# Patient Record
Sex: Female | Born: 1980 | Race: White | Hispanic: No | Marital: Married | Smoking: Never smoker
Health system: Southern US, Community
[De-identification: ages and names within clinical notes are randomized; demographics above are authoritative.]

## PROBLEM LIST (undated history)

## (undated) DIAGNOSIS — O24419 Gestational diabetes mellitus in pregnancy, unspecified control: Secondary | ICD-10-CM

## (undated) DIAGNOSIS — K219 Gastro-esophageal reflux disease without esophagitis: Secondary | ICD-10-CM

## (undated) DIAGNOSIS — F32A Depression, unspecified: Secondary | ICD-10-CM

## (undated) DIAGNOSIS — J302 Other seasonal allergic rhinitis: Secondary | ICD-10-CM

## (undated) DIAGNOSIS — F419 Anxiety disorder, unspecified: Secondary | ICD-10-CM

## (undated) DIAGNOSIS — S82122A Displaced fracture of lateral condyle of left tibia, initial encounter for closed fracture: Secondary | ICD-10-CM

## (undated) HISTORY — DX: Gestational diabetes mellitus in pregnancy, unspecified control: O24.419

## (undated) HISTORY — DX: Anxiety disorder, unspecified: F41.9

## (undated) HISTORY — DX: Depression, unspecified: F32.A

## (undated) HISTORY — DX: Gastro-esophageal reflux disease without esophagitis: K21.9

---
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
## 2004-09-16 ENCOUNTER — Inpatient Hospital Stay: Payer: Self-pay | Admitting: Obstetrics and Gynecology

## 2006-02-24 ENCOUNTER — Ambulatory Visit: Payer: Self-pay

## 2006-10-20 ENCOUNTER — Ambulatory Visit: Payer: Self-pay | Admitting: Internal Medicine

## 2007-01-28 ENCOUNTER — Ambulatory Visit: Payer: Self-pay | Admitting: Internal Medicine

## 2008-01-07 ENCOUNTER — Ambulatory Visit: Payer: Self-pay | Admitting: Internal Medicine

## 2008-01-07 LAB — CONVERTED CEMR LAB
Free T4: 0.6 ng/dL (ref 0.6–1.6)
T3, Free: 3 pg/mL (ref 2.3–4.2)
TSH: 4.12 microintl units/mL (ref 0.35–5.50)

## 2008-03-15 ENCOUNTER — Encounter: Admission: RE | Admit: 2008-03-15 | Discharge: 2008-03-15 | Payer: Self-pay | Admitting: Obstetrics and Gynecology

## 2008-05-18 ENCOUNTER — Inpatient Hospital Stay (HOSPITAL_COMMUNITY): Admission: RE | Admit: 2008-05-18 | Discharge: 2008-05-21 | Payer: Self-pay | Admitting: Obstetrics and Gynecology

## 2008-11-29 ENCOUNTER — Ambulatory Visit: Payer: Self-pay | Admitting: Internal Medicine

## 2008-11-29 DIAGNOSIS — K625 Hemorrhage of anus and rectum: Secondary | ICD-10-CM

## 2009-01-15 ENCOUNTER — Emergency Department (HOSPITAL_COMMUNITY): Admission: EM | Admit: 2009-01-15 | Discharge: 2009-01-15 | Payer: Self-pay | Admitting: Emergency Medicine

## 2009-03-14 ENCOUNTER — Ambulatory Visit: Payer: Self-pay | Admitting: Internal Medicine

## 2009-03-14 DIAGNOSIS — N390 Urinary tract infection, site not specified: Secondary | ICD-10-CM | POA: Insufficient documentation

## 2009-03-14 LAB — CONVERTED CEMR LAB
Bilirubin Urine: NEGATIVE
Specific Gravity, Urine: >=1.03 (ref 1.000–1.030)
Urobilinogen, UA: 0.2 (ref 0.0–1.0)
pH: 5.5 (ref 5.0–8.0)

## 2009-06-28 ENCOUNTER — Ambulatory Visit: Payer: Self-pay | Admitting: Internal Medicine

## 2009-06-28 LAB — CONVERTED CEMR LAB: hCG, Beta Chain, Quant, S: <0.5 milliintl units/mL

## 2009-06-29 LAB — HM PAP SMEAR

## 2010-02-19 ENCOUNTER — Ambulatory Visit: Payer: Self-pay | Admitting: Internal Medicine

## 2010-02-19 ENCOUNTER — Ambulatory Visit: Payer: Self-pay | Admitting: Cardiology

## 2010-02-19 DIAGNOSIS — R300 Dysuria: Secondary | ICD-10-CM | POA: Insufficient documentation

## 2010-02-19 LAB — CONVERTED CEMR LAB
Bilirubin Urine: NEGATIVE
Leukocytes, UA: NEGATIVE
Specific Gravity, Urine: 1.025 (ref 1.005–1.030)
Urine Glucose: NEGATIVE mg/dL
Urobilinogen, UA: 0.2 (ref 0.0–1.0)

## 2010-02-27 ENCOUNTER — Ambulatory Visit: Payer: Self-pay | Admitting: Family Medicine

## 2010-02-27 DIAGNOSIS — Z862 Personal history of diseases of the blood and blood-forming organs and certain disorders involving the immune mechanism: Secondary | ICD-10-CM | POA: Insufficient documentation

## 2010-02-27 DIAGNOSIS — Z8632 Personal history of gestational diabetes: Secondary | ICD-10-CM | POA: Insufficient documentation

## 2010-02-27 DIAGNOSIS — R5381 Other malaise: Secondary | ICD-10-CM

## 2010-02-27 DIAGNOSIS — Z8639 Personal history of other endocrine, nutritional and metabolic disease: Secondary | ICD-10-CM

## 2010-02-27 DIAGNOSIS — Z8742 Personal history of other diseases of the female genital tract: Secondary | ICD-10-CM

## 2010-02-27 DIAGNOSIS — J309 Allergic rhinitis, unspecified: Secondary | ICD-10-CM | POA: Insufficient documentation

## 2010-02-27 DIAGNOSIS — R5383 Other fatigue: Secondary | ICD-10-CM

## 2010-02-27 DIAGNOSIS — K219 Gastro-esophageal reflux disease without esophagitis: Secondary | ICD-10-CM | POA: Insufficient documentation

## 2010-02-28 ENCOUNTER — Encounter: Payer: Self-pay | Admitting: Family Medicine

## 2010-02-28 LAB — CONVERTED CEMR LAB
ALT: 13 units/L (ref 0–35)
AST: 15 units/L (ref 0–37)
Albumin: 4.1 g/dL (ref 3.5–5.2)
Basophils Relative: 0.6 % (ref 0.0–3.0)
CO2: 30 meq/L (ref 19–32)
Eosinophils Relative: 1.1 % (ref 0.0–5.0)
Glucose, Bld: 76 mg/dL (ref 70–99)
HDL: 51.3 mg/dL (ref >39.00)
Hgb A1c MFr Bld: 5.5 % (ref 4.6–6.5)
MCV: 92 fL (ref 78.0–100.0)
Monocytes Absolute: 0.3 10*3/uL (ref 0.1–1.0)
Monocytes Relative: 5.5 % (ref 3.0–12.0)
Neutrophils Relative %: 53.3 % (ref 43.0–77.0)
Platelets: 223 10*3/uL (ref 150.0–400.0)
Potassium: 4.1 meq/L (ref 3.5–5.1)
RBC: 4.25 M/uL (ref 3.87–5.11)
Sodium: 143 meq/L (ref 135–145)
TSH: 0.77 microintl units/mL (ref 0.35–5.50)
Total Bilirubin: 0.5 mg/dL (ref 0.3–1.2)
Total CHOL/HDL Ratio: 2
Triglycerides: 68 mg/dL (ref 0.0–149.0)
VLDL: 13.6 mg/dL (ref 0.0–40.0)
WBC: 6.3 10*3/uL (ref 4.5–10.5)

## 2010-03-02 ENCOUNTER — Telehealth: Payer: Self-pay | Admitting: Family Medicine

## 2010-06-05 ENCOUNTER — Ambulatory Visit: Payer: Self-pay | Admitting: Family Medicine

## 2010-06-05 DIAGNOSIS — L723 Sebaceous cyst: Secondary | ICD-10-CM

## 2010-06-05 DIAGNOSIS — L259 Unspecified contact dermatitis, unspecified cause: Secondary | ICD-10-CM

## 2010-06-14 ENCOUNTER — Ambulatory Visit: Payer: Self-pay | Admitting: Internal Medicine

## 2010-06-14 LAB — CONVERTED CEMR LAB
Leukocytes, UA: NEGATIVE
Nitrite: NEGATIVE
Specific Gravity, Urine: 1.02 (ref 1.000–1.030)
Total Protein, Urine: NEGATIVE mg/dL
pH: 6.5 (ref 5.0–8.0)

## 2010-07-04 ENCOUNTER — Encounter: Payer: Self-pay | Admitting: Family Medicine

## 2010-10-16 NOTE — Assessment & Plan Note (Signed)
Summary: NEW PT TO EST/CLE   Vital Signs:  Patient profile:   30 year old female Height:      63 inches Weight:      129.50 pounds BMI:     23.02 Temp:     98.6 degrees F oral Pulse rate:   64 / minute Pulse rhythm:   regular BP sitting:   90 / 72  (left arm) Cuff size:   regular  Vitals Entered By: Linde Gillis CMA Duncan Dull) (February 27, 2010 10:51 AM) CC: new patient   History of Present Illness: 30 yo very pleasant female here to establish care.   Had UTI earlier this month. CT of abdomen/pelvis due to suprapubic pain, neg for nephrolithiasis but she does have 14 mm gallstone without gallbladder thickening.  Denies any symptoms of billiary colic.  H/o gestational DM- with last pregnancy, was diet controlled.  Recently has had episodes of hypoglyemia, CBG in 60s only a couple of hours after eating.  Feels dizzy and sweaty.  Does have FH of DM.  Denies any polyuria or polydypsia.  Has been more tired lately.  No CP or SOB.  Well woman- UTD on prevention. Had lipid panel checked at work at couple of years ago, WNL. Dr.  Billy Coast is Gyn. 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 Physically active, walks on treadmill, has two small children.   Preventive Screening-Counseling & Management  Caffeine-Diet-Exercise     Does Patient Exercise: yes  Current Medications (verified): 1)  Nexium 40 Mg Cpdr (Esomeprazole Magnesium) .... Take One Tablet By Mouth Daily 2)  Ortho Tri-Cyclen Lo 0.18/0.215/0.25 Mg-25 Mcg Tabs (Norgestim-Eth Estrad Triphasic) .... Take One Tablet By Mouth Daily 3)  Zyrtec Allergy 10 Mg Tabs (Cetirizine Hcl) .... Take One Tablet By Mouth Daily  Allergies: 1)  ! 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  Alcohol Use - yes Daily Caffeine Use Illicit Drug Use - no Regular exercise-yes  Risk Factors: Exercise: yes (02/27/2010)  Risk Factors: Smoking Status: never (11/29/2008)  Past Medical History: Gestational diabetes GERD Allergic rhinitis  Past Surgical History: C-sections x 2 (2006, 2009)  Family History: Reviewed history from 11/29/2008 and no changes required. Family History of Colon Polyps:Father Family History of Diabetes: Grandmother Dad died of brain tumor at 59 Mom alive and healthy  Social History: Reviewed history from 11/29/2008 and no changes required. 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  Alcohol Use - yes Daily Caffeine Use Illicit Drug Use - no Regular exercise-yes Does Patient Exercise:  yes  Review of Systems      See HPI General:  Denies malaise. Eyes:  Denies blurring. ENT:  Denies difficulty swallowing. CV:  Denies chest pain or discomfort and shortness of breath with exertion. Resp:  Denies shortness of breath. GI:  Denies abdominal pain, bloody stools, change in bowel habits, nausea, and vomiting. GU:  Denies discharge and dysuria. MS:  Denies joint pain, joint redness, and joint swelling. Derm:  Denies rash. Neuro:  Denies weakness. Psych:  Denies anxiety and depression. Endo:  Denies cold intolerance and heat intolerance. Heme:  Denies abnormal bruising and bleeding.  Physical Exam  General:  Well developed, well nourished, no acute distress. Head:  normocephalic and atraumatic.   Eyes:  vision grossly intact and pupils equal.   Ears:  R ear normal and L ear normal.   Nose:  no external deformity.   Mouth:  good dentition.   Neck:  No deformities, masses, or tenderness noted. Lungs:  Normal respiratory effort, chest expands symmetrically. Lungs are clear to auscultation, no crackles or wheezes. Heart:  Normal rate and regular rhythm. S1 and S2  normal without gallop, murmur, click, rub or other extra sounds. Abdomen:  Bowel sounds positive,abdomen soft and non-tender without masses, organomegaly or hernias noted. Extremities:  No clubbing, cyanosis, edema, or deformity noted with normal full range of motion of all joints.   Neurologic:  alert & oriented X3.   Skin:  nevus, no dysplastic nevi noted.   Psych:  Oriented X3, memory intact for recent and remote, normally interactive, good eye contact, not anxious appearing, and not depressed appearing.     Impression & Recommendations:  Problem # 1:  Preventive Health Care (ICD-V70.0) Reviewed preventive care protocols, scheduled due services, and updated immunizations Discussed nutrition, exercise, diet, and healthy lifestyle. FLP, hepatic panel today.  Problem # 2:  FATIGUE (ICD-780.79) Assessment: New LIkely due to busy lifestyle.  Will check TSH and CBC today. Orders: Venipuncture (40981) TLB-TSH (Thyroid Stimulating Hormone) (84443-TSH) TLB-CBC Platelet - w/Differential (85025-CBCD)  Problem # 3:  DIABETES MELLITUS, GESTATIONAL, HX OF (ICD-V13.29) Assessment: Unchanged with recent episodes of symptomatic hyoglycemia.  Will check a1c today. Orders: TLB-BMP (Basic Metabolic Panel-BMET) (80048-METABOL) TLB-A1C / Hgb A1C (Glycohemoglobin) (83036-A1C)  Complete Medication List: 1)  Nexium 40 Mg Cpdr (Esomeprazole magnesium) .... Take one tablet by mouth daily 2)  Ortho Tri-cyclen Lo 0.18/0.215/0.25 Mg-25 Mcg Tabs (Norgestim-eth estrad triphasic) .... Take one tablet by mouth daily 3)  Zyrtec Allergy 10 Mg Tabs (Cetirizine hcl) .... Take one tablet by mouth daily  Other Orders: TLB-Lipid Panel (80061-LIPID) TLB-Hepatic/Liver Function Pnl (80076-HEPATIC)  Current Allergies (reviewed today): ! CODEINE  TD Result Date:  06/29/2009 TD Result:  historical TD Next Due:  10 yr PAP Result Date:  06/29/2009 PAP Result:  normal

## 2010-10-16 NOTE — Letter (Signed)
Summary: Generic Letter  [facility]  [address] [place], [place] [address]   Phone: [phone]  Fax: [phone]    02/28/2010  [facility] [address] [place], [place]  [address]  Dear Ms. Prime,   Dr. Dayton Martes says that all of your labs look great! Thyroid function normal. BMET normal.  A1C is ok, we can continue to monitor periodically. Cholesterol is excellent!  Keep up the good work!       Sincerely,        Ruthe Mannan, MD

## 2010-10-16 NOTE — Progress Notes (Signed)
Summary: lab report faxed  ---- Converted from flag ---- ---- 03/02/2010 8:24 AM, Ruthe Mannan MD wrote: please see below.  ok to fax her lab work to her. Thanks, Talia  ---- 03/02/2010 8:23 AM, Meredith Staggers, RN wrote: Cleone Slim Dr Dayton Martes, I received a letter from your office that my labwork was good which is great, I was just wondering if I could get a copy of my labs, your office can either mail them to me or fax them to me at the office at [phone] either way is fine with me, thanks so much ------------------------------  Lab report faxed to [phone] as instructed.Lewanda Rife LPN  March 02, 2010 11:19 AM

## 2010-10-16 NOTE — Consult Note (Signed)
Summary: [facility]  [facility]   Imported By: Lanelle Bal 07/27/2010 14:47:54  _____________________________________________________________________  External Attachment:    Type:   Image     Comment:   External Document

## 2010-10-16 NOTE — Assessment & Plan Note (Signed)
Summary: KNOT ON BACK OF HEAD/JRR   Vital Signs:  Patient profile:   30 year old female Height:      63 inches Weight:      128 pounds BMI:     22.76 Temp:     97.7 degrees F oral Pulse rate:   76 / minute Pulse rhythm:   regular BP sitting:   102 / 70  (left arm) Cuff size:   regular  Vitals Entered By: Linde Gillis CMA Duncan Dull) (June 05, 2010 11:46 AM) CC: knot on back of head   History of Present Illness: Very pleasant 30 yo with scalp eruption and now with "knot" on back of her head.  Noticed itchy rash on left side of scalp 3 weeks ago.  Benadryl has not helped. Tried OTC dandruff shampoo and most recently Nizoral shampoo with no relief of symptoms. Prior to developping the rash, no changes in her soaps or detergants.   Never had anything like this before.  3 days ago, noticed a large subcutaneous bump on the left side of her scalp as well.  Tender to palpation but less tender than it was a few days ago.  No overlying erythema, fevers, chills, nausea or vomiting.  Current Medications (verified): 1)  Nexium 40 Mg Cpdr (Esomeprazole Magnesium) .... Take One Tablet By Mouth Daily 2)  Ortho Tri-Cyclen Lo 0.18/0.215/0.25 Mg-25 Mcg Tabs (Norgestim-Eth Estrad Triphasic) .... Take One Tablet By Mouth Daily 3)  Zyrtec Allergy 10 Mg Tabs (Cetirizine Hcl) .... Take One Tablet By Mouth Daily 4)  Nizoral 2 % Sham (Ketoconazole) .... Use Daily, Lather, Rinse After 5- 10 Min 5)  Clobetasol Propionate 0.05 % Crea (Clobetasol Propionate) .... Apply To Affected Area Two Times A Day.  Allergies: 1)  ! 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  Alcohol Use - yes Daily Caffeine Use Illicit Drug Use - no Regular exercise-yes  Risk Factors: Exercise: yes (02/27/2010)  Risk Factors: Smoking Status: never (11/29/2008)  Review of Systems      See HPI General:  Denies chills and fever. Derm:  Complains of rash; denies poor wound healing.  Physical Exam  General:  Well developed, well nourished, no acute distress. Skin:  erythematous raised plaques on left side of scalp, mildly scaly. Palpable 3 cm subcutaneous cyst in scalp, no erythemta, mildly TTP. Right elbow- small scaly plaque Psych:  Oriented X3, memory intact for recent and remote, normally interactive, good eye contact, not anxious appearing, and not depressed appearing.     Impression & Recommendations:  Problem # 1:  CONTACT DERMATITIS&OTHER ECZEMA DUE UNSPEC CAUSE (ICD-692.9) Assessment New Etiology unknown.  Folliculitis vs. eczema or possible psorasis. Will try topical clobestasol cream and refer to derm for biopsy and further work up given small plaque on elbow as well (may be completely benign). Her updated medication list for this problem includes:    Zyrtec Allergy 10 Mg Tabs (Cetirizine hcl) .Marland Kitchen... Take one tablet by mouth daily    Clobetasol Propionate 0.05 % Crea (Clobetasol propionate) .Marland Kitchen... Apply to affected area two times a day.  Orders: Dermatology Referral (  Derma)  Problem # 2:  SEBACEOUS CYST (ICD-706.2) Assessment: New Can discuss removal if necessary with dermatology at her appointment but will hopefully be resolved by then.  Complete Medication List: 1)  Nexium 40 Mg Cpdr (Esomeprazole magnesium) .... Take one tablet by mouth daily 2)  Ortho Tri-cyclen Lo 0.18/0.215/0.25 Mg-25 Mcg Tabs (Norgestim-eth estrad triphasic) .... Take one tablet by mouth daily 3)  Zyrtec Allergy 10 Mg Tabs (Cetirizine hcl) .... Take one  tablet by mouth daily 4)  Nizoral 2 % Sham (Ketoconazole) .... Use daily, lather, rinse after 5- 10 min 5)  Clobetasol Propionate 0.05 % Crea (Clobetasol propionate) .... Apply to affected area two times a day.  Patient Instructions: 1)  Great to see you, Porchia. 2)  Please stop by to see Aram Beecham on your way out. Prescriptions: CLOBETASOL PROPIONATE 0.05 % CREA (CLOBETASOL PROPIONATE) apply to affected area two times a day.  #30 g x 0   Entered and Authorized by:   Ruthe Mannan MD   Signed by:   Ruthe Mannan MD on 06/05/2010   Method used:   Electronically to        [facility]* (retail)       [address]       [address] [place] [address]ing       [place], [place]  [address]       Ph: [phone]       Fax: [phone]   RxID:   [id number]   Current Allergies (reviewed today): ! CODEINE

## 2010-10-25 ENCOUNTER — Encounter: Payer: Self-pay | Admitting: Internal Medicine

## 2010-10-25 ENCOUNTER — Other Ambulatory Visit (INDEPENDENT_AMBULATORY_CARE_PROVIDER_SITE_OTHER): Payer: 59

## 2010-10-25 DIAGNOSIS — R0989 Other specified symptoms and signs involving the circulatory and respiratory systems: Secondary | ICD-10-CM

## 2010-10-25 DIAGNOSIS — R350 Frequency of micturition: Secondary | ICD-10-CM | POA: Insufficient documentation

## 2010-10-25 LAB — CONVERTED CEMR LAB
Bacteria, UA: NONE SEEN
Casts: NONE SEEN /lpf
Crystals: NONE SEEN
Ketones, ur: NEGATIVE mg/dL
Nitrite: NEGATIVE
Specific Gravity, Urine: 1.018 (ref 1.005–1.030)
Urobilinogen, UA: 0.2 (ref 0.0–1.0)
pH: 7 (ref 5.0–8.0)

## 2010-10-26 ENCOUNTER — Encounter: Payer: Self-pay | Admitting: Internal Medicine

## 2010-11-01 NOTE — Miscellaneous (Addendum)
Summary: RX  Sulfamethoxazole-TMP/Diflucan  Clinical Lists Changes  Medications: Added new medication of FLUCONAZOLE 150 MG TABS (FLUCONAZOLE) take as directed - Signed Added new medication of SULFAMETHOXAZOLE-TMP DS 800-160 MG TABS (SULFAMETHOXAZOLE-TRIMETHOPRIM) one two times a day for 3 days - Signed Rx of FLUCONAZOLE 150 MG TABS (FLUCONAZOLE) take as directed;  #2 x 1;  Signed;  Entered by: Charolotte Capuchin, RN;  Authorized by: Dolores Patty, MD, [facility];  Method used: Electronically to [facility]*, [address] Shipping/mailing, [place], [place]  [address], Ph: [phone], Fax: [phone] Rx of SULFAMETHOXAZOLE-TMP DS 800-160 MG TABS (SULFAMETHOXAZOLE-TRIMETHOPRIM) one two times a day for 3 days;  #6 x 0;  Signed;  Entered by: Charolotte Capuchin, RN;  Authorized by: Dolores Patty, MD, [facility];  Method used: Electronically to [facility]*, [address], [address] Shipping/mailing, [place], [place]  [address], Ph: [phone], Fax: [phone]    Prescriptions: SULFAMETHOXAZOLE-TMP DS 800-160 MG TABS (SULFAMETHOXAZOLE-TRIMETHOPRIM) one two times a day for 3 days  #6 x 0   Entered by:   Charolotte Capuchin, RN   Authorized by:   Dolores Patty, MD, [facility]   Signed by:   Charolotte Capuchin, RN on 10/26/2010   Method used:   Electronically to        [facility]* (retail)       [address]       [address] Shipping/mailing       [place], [place]  [address]       Ph: [phone]       Fax: [phone]   RxID:   [id number] FLUCONAZOLE 150 MG TABS (FLUCONAZOLE) take as directed  #2 x 1   Entered by:   Charolotte Capuchin, RN   Authorized by:   Dolores Patty, MD, [facility]   Signed by:   Charolotte Capuchin, RN on 10/26/2010   Method used:   Electronically to        [facility]* (retail)       [address]       [address] Shipping/mailing  [place], [place]  [address]       Ph: [phone]       Fax: [phone]   RxID:   [id number]

## 2011-01-29 NOTE — Op Note (Signed)
[name], [name]               ACCOUNT NO.:  [id number]   MEDICAL RECORD NO.:  [id number]          PATIENT TYPE:  INP   LOCATION:  9125                          [facility]ILITY:  [facility]   PHYSICIAN:  Lenoard Aden, M.D.DATE OF BIRTH:  1980-10-24   DATE OF PROCEDURE:  DATE OF DISCHARGE:                               OPERATIVE REPORT   PREOPERATIVE DIAGNOSIS:  Previous cesarean section now at 39 weeks.   POSTOPERATIVE DIAGNOSIS:  Previous cesarean section now at 39 weeks.   PROCEDURE:  Repeat low-segment transverse cesarean section.   SURGEON:  Lenoard Aden, MD   ANESTHESIA:  Spinal.   ESTIMATED BLOOD LOSS:  Less than 800 mL.   COMPLICATIONS:  None.   DRAINS:  Foley.   COUNTS:  Correct.   The patient recovery in good condition.   FINDINGS:  Full-term living female.  Apgars 8 and 9.  Thin non-dehisced  lower uterine segment.  Normal tubes.  Normal ovaries.  Two-layer  closure.  There are some also filmy adhesions in the lower uterine to  the lower uterine segment to the anterior abdominal wall and bladder.   BRIEF OPERATIVE NOTE:  After being apprised of risks of anesthesia,  infection, bleeding, injury to abdominal organs, need for repair,  delayed versus immediate complications including bowel and bladder  injury, the patient brought to the operating room where she was  administered a spinal anesthetic without complications, and prepped and  draped in usual sterile fashion.  Foley catheter placed.  After  achieving anesthesia, dilute Marcaine solution placed.  Pfannenstiel  skin incision made with a scalpel, carried down to fascia, which was  nicked in the midline and transversely using Mayo scissors.  Rectus  muscles dissected sharply in the midline.  Peritoneum entered sharply.  Bladder blade placed.  Visceral peritoneum scoured sharply off the  uterine segment.  Thinning of the lower uterine segment was noted.  Kerr  hysterotomy incision made.  Atraumatic delivery  of full term living  female handed to pediatrician's attendance.  Apgars as noted.  Placenta  delivered manually.  Intact three-vessel cord.  Uterus curetted using a  dry lap pack and closed in 2 running imbricating layers of 0 Monocryl  suture.  Good hemostasis noted.  Bladder flap inspected, found to be hemostatic.  Irrigation  accomplished.  Good hemostasis achieved.  The fascia was then closed  using 0 Monocryl in a continuous running fashion.  Skin was  reapproximated using skin staples.  The patient tolerated the procedure  well and was transferred to recovery in good condition.      Lenoard Aden, M.D.  Electronically Signed     RJT/MEDQ  D:  05/18/2008  T:  05/19/2008  Job:  [id number]

## 2011-01-29 NOTE — H&P (Signed)
[name], [name]               ACCOUNT NO.:  [id number]   MEDICAL RECORD NO.:  [id number]          PATIENT TYPE:  INP   LOCATION:                                [facility]   PHYSICIAN:  Lenoard Aden, M.D.DATE OF BIRTH:  October 16, 1980   DATE OF ADMISSION:  05/18/2008  DATE OF DISCHARGE:                              HISTORY & PHYSICAL   CHIEF COMPLAINT:  Repeat C-section at 39 weeks.   She is a 30 year old white female G2, P1 at 54 weeks' gestation with a  history of previous C-section x1, who presents for repeat C-section at  39 weeks.   ALLERGIES:  She is allergic to VICODIN, CODEINE, MORPHINE DERIVATIVES,  and no known latex allergies.   MEDICATIONS:  Prenatal vitamins.   She is a nonsmoker, nondrinker.  Denies domestic physical violence.   She has a family history of heart disease, diabetes plus pancreatic,  colon, and brain cancer.   Prenatal course was complicated by gestational diabetes, well controlled  on diet.   PHYSICAL EXAMINATION:  GENERAL:  She is a well-developed, well-nourished  white female in no acute distress.  HEENT:  Normal.  LUNGS:  Clear.  HEART:  Regular rate and rhythm.  ABDOMEN:  Soft, gravid, and nontender.  Cervix is closed, 70% vertex, -  1.  EXTREMITIES:  There are no cords.  NEUROLOGIC:  Nonfocal.  SKIN:  Intact.   IMPRESSION:  1. A 39-week intrauterine pregnancy  2. Previous cesarean section.   PLAN:  Plan is to proceed with repeat low-transverse cesarean section.  The risks of anesthesia, infection, bleeding, injury to abdominal  organs, and need for repair were discussed and the labor's immediate  complications to include bowel and bladder injury.  The patient  wishes  to proceed.      Lenoard Aden, M.D.  Electronically Signed     RJT/MEDQ  D:  05/18/2008  T:  05/19/2008  Job:  [id number]

## 2011-02-01 NOTE — Discharge Summary (Signed)
NAMEMALINDA, [name]               ACCOUNT NO.:  [id number]   MEDICAL RECORD NO.:  [id number]          PATIENT TYPE:  INP   LOCATION:  9125                          [facility]ILITY[facility]   PHYSICIAN:  Lenoard Aden, M.D.DATE OF BIRTH:  Jul 16, 1981   DATE OF ADMISSION:  05/18/2008  DATE OF DISCHARGE:  05/21/2008                               DISCHARGE SUMMARY   ADMISSION DIAGNOSIS:  Previous cesarean section for repeat at term.   DISCHARGE DIAGNOSIS:  Previous cesarean section for repeat at term.   The patient underwent uncomplicated repeat low-segment transverse  cesarean section.   Postoperative course uncomplicated.  Tolerating diet well.   Discharged to home on day #3.  Discharge teaching done.   DISCHARGE MEDICATIONS:  Prenatal vitamins, iron, and Tylox.   Follow up in the office in 4-6 weeks.      Lenoard Aden, M.D.  Electronically Signed     RJT/MEDQ  D:  07/14/2008  T:  07/14/2008  Job:  [id number]

## 2011-04-24 ENCOUNTER — Ambulatory Visit: Payer: 59 | Admitting: *Deleted

## 2011-04-24 ENCOUNTER — Telehealth: Payer: Self-pay | Admitting: Cardiology

## 2011-04-24 DIAGNOSIS — R300 Dysuria: Secondary | ICD-10-CM

## 2011-04-24 LAB — URINALYSIS, ROUTINE W REFLEX MICROSCOPIC
Bilirubin Urine: NEGATIVE
Ketones, ur: NEGATIVE
Specific Gravity, Urine: >=1.03 (ref 1.000–1.030)
Urobilinogen, UA: 0.2 (ref 0.0–1.0)

## 2011-04-24 MED ORDER — SULFAMETHOXAZOLE-TRIMETHOPRIM 800-160 MG PO TABS: 1.0000 | ORAL_TABLET | Freq: Two times a day (BID) | ORAL | Status: AC

## 2011-04-24 MED ORDER — FLUCONAZOLE 100 MG PO TABS: 100.0000 mg | ORAL_TABLET | Freq: Every day | ORAL | Status: AC

## 2011-04-24 NOTE — Telephone Encounter (Signed)
Per Dr. Gala Romney, patient needed a UA and Culture.

## 2011-04-24 NOTE — Telephone Encounter (Signed)
Antbx. Ordered per Dr. Gala Romney.

## 2011-04-27 LAB — URINE CULTURE: Colony Count: 80000

## 2011-06-10 ENCOUNTER — Telehealth: Payer: Self-pay | Admitting: *Deleted

## 2011-06-10 DIAGNOSIS — N390 Urinary tract infection, site not specified: Secondary | ICD-10-CM

## 2011-06-10 MED ORDER — SULFAMETHOXAZOLE-TRIMETHOPRIM 800-160 MG PO TABS: ORAL_TABLET | ORAL | Status: DC

## 2011-06-10 MED ORDER — FLUCONAZOLE 100 MG PO TABS: ORAL_TABLET | ORAL | Status: DC

## 2011-06-10 NOTE — Telephone Encounter (Signed)
Called in for patient.

## 2011-06-19 LAB — GLUCOSE, CAPILLARY
Glucose-Capillary: 65 — ABNORMAL LOW
Glucose-Capillary: 68 — ABNORMAL LOW

## 2011-06-19 LAB — CBC
Hemoglobin: 11.3 — ABNORMAL LOW
Hemoglobin: 9.7 — ABNORMAL LOW
MCHC: 32.6
MCHC: 32.9
Platelets: 129 — ABNORMAL LOW
Platelets: 147 — ABNORMAL LOW
RDW: 13.8
RDW: 14.1

## 2011-06-19 LAB — BASIC METABOLIC PANEL
CO2: 21
Calcium: 8.8
Creatinine, Ser: 0.62
GFR calc non Af Amer: >60
Glucose, Bld: 101 — ABNORMAL HIGH
Sodium: 135

## 2011-06-19 LAB — RPR: RPR Ser Ql: NONREACTIVE

## 2011-09-26 ENCOUNTER — Encounter: Payer: Self-pay | Admitting: Family Medicine

## 2011-09-26 ENCOUNTER — Telehealth: Payer: Self-pay

## 2011-09-26 NOTE — Telephone Encounter (Signed)
Pt started today with burning and pain upon urination with frequency. Pt scheduled appt with Dr Alphonsus Sias 09/27/11 at 3pm.

## 2011-09-27 ENCOUNTER — Ambulatory Visit: Payer: 59 | Admitting: Internal Medicine

## 2011-11-15 ENCOUNTER — Ambulatory Visit (HOSPITAL_COMMUNITY)
Admission: RE | Admit: 2011-11-15 | Discharge: 2011-11-15 | Disposition: A | Discharge status: Home / Self Care | Payer: 59 | Source: Ambulatory Visit | Attending: Family Medicine | Admitting: Family Medicine

## 2011-11-15 ENCOUNTER — Telehealth: Payer: Self-pay | Admitting: Family Medicine

## 2011-11-15 ENCOUNTER — Encounter: Payer: Self-pay | Admitting: Family Medicine

## 2011-11-15 ENCOUNTER — Ambulatory Visit (INDEPENDENT_AMBULATORY_CARE_PROVIDER_SITE_OTHER): Payer: 59 | Admitting: Family Medicine

## 2011-11-15 ENCOUNTER — Other Ambulatory Visit: Payer: Self-pay | Admitting: Family Medicine

## 2011-11-15 VITALS — BP 100/70 | HR 72 | Temp 98.4°F | Ht 64.0 in | Wt 122.1 lb

## 2011-11-15 DIAGNOSIS — Z9889 Other specified postprocedural states: Secondary | ICD-10-CM | POA: Insufficient documentation

## 2011-11-15 DIAGNOSIS — K8020 Calculus of gallbladder without cholecystitis without obstruction: Secondary | ICD-10-CM | POA: Insufficient documentation

## 2011-11-15 DIAGNOSIS — R1011 Right upper quadrant pain: Secondary | ICD-10-CM | POA: Insufficient documentation

## 2011-11-15 DIAGNOSIS — R110 Nausea: Secondary | ICD-10-CM | POA: Insufficient documentation

## 2011-11-15 LAB — CBC WITH DIFFERENTIAL/PLATELET
Basophils Absolute: 0 10*3/uL (ref 0.0–0.1)
Eosinophils Relative: 1 % (ref 0–5)
Lymphocytes Relative: 39 % (ref 12–46)
Lymphs Abs: 2.8 10*3/uL (ref 0.7–4.0)
MCV: 88.2 fL (ref 78.0–100.0)
Neutro Abs: 4 10*3/uL (ref 1.7–7.7)
Neutrophils Relative %: 56 % (ref 43–77)
Platelets: 219 10*3/uL (ref 150–400)
RBC: 4.57 MIL/uL (ref 3.87–5.11)
RDW: 12.4 % (ref 11.5–15.5)
WBC: 7.1 10*3/uL (ref 4.0–10.5)

## 2011-11-15 LAB — HEPATIC FUNCTION PANEL
Alkaline Phosphatase: 54 U/L (ref 39–117)
Indirect Bilirubin: 0.2 mg/dL (ref 0.0–0.9)
Total Bilirubin: 0.3 mg/dL (ref 0.3–1.2)
Total Protein: 7.3 g/dL (ref 6.0–8.3)

## 2011-11-15 NOTE — Telephone Encounter (Signed)
Advised patient

## 2011-11-15 NOTE — Telephone Encounter (Signed)
I would recommend that she go to urgent care because she will likely need imaging, like abdominal ultrasound given the way she is describing pain. Given that it has been going on for several days, she needs to get this evaluated.

## 2011-11-15 NOTE — Telephone Encounter (Signed)
Called pt's home and cell numbers, no answers on either, left messge on both voice mails asking patient to call back.

## 2011-11-15 NOTE — Telephone Encounter (Signed)
Triage Record Num: [id number] Operator: Chevis Pretty Patient Name: [name] Call Date & Time: 11/15/2011 11:17:14AM Patient Phone: [phone] PCP: Ruthe Mannan Patient Gender: Female PCP Fax : [phone] Patient DOB: [birth year] Practice Name: [facility] Reason for Call: Caller: Toriann/Patient; PCP: Ruthe Mannan Nestor Ramp); CB#: [phone]; ; ; Call regarding Nausea and Abdominal Pain onset 11/12/11; pain is intermittent, and has a sharp, knife-like quality to it. No vomiting or diarrhea. Afebrile. States has decreased appetite > 3 days due to the nausea. States frequent burping. Abdominal RUQ pain as described above; occasional pain to upper back. Per protocol, emergent symptoms denied; advised appt within 72 hours; patient states she would like to be seen today. Per Epic, all appts full; info to office for appt. MAY REACH PATIENT AT [phone]. Protocol(s) Used: Nausea or Vomiting Recommended Outcome per Protocol: See Provider within 72 Hours Reason for Outcome: Loss of appetite for 3 or more days OR nausea for 7 or more days Care Advice: ~ Keep a diary of all food and drink that has been consumed until evaluated by provider. ~ Call provider if pain or vomiting develop or unable to eat or drink anything. ~ SYMPTOM / CONDITION MANAGEMENT ~ CAUTIONS ~ List, or take, all current prescription(s), nonprescription or alternative medication(s) to provider for evaluation. Nausea Care Advice: - Drink small amounts of clear, sweetened liquids or ice cold drinks. - Eat light, bland foods such as saltine crackers or plain bread. - Do not eat high fat, highly seasoned, high fiber, or high sugar content foods. - Avoid mixing hot food and cold foods. - Eat smaller, more frequent meals. - Rest as much as possible in a sitting or in a propped lying position. Do not lie flat for at least 2 hours after eating. - Do not take pain medication (such as aspirin, NSAIDs) while  nauseated. - Rest as much as possible until symptoms improve since activity may worsen nausea. ~ 11/15/2011 11:28:40AM Page 1 of 1 [email or web address] [facility] Triage Call Report Triage Record Num: [id number] Operator: Chevis Pretty Patient Name: [name] Call Date & Time: 11/15/2011 11:17:14AM Patient Phone: [phone] PCP: Ruthe Mannan Patient Gender: Female PCP Fax : [phone] Patient DOB: [birth year] Practice Name: [facility] Reason for Call: Caller: Ema/Patient; PCP: Ruthe Mannan Nestor Ramp); CB#: [phone]; ; ; Call regarding Nausea and Abdominal Pain onset 11/12/11; pain is intermittent, and has a sharp, knife-like quality to it. No vomiting or diarrhea. Afebrile. States has decreased appetite > 3 days due to the nausea. States frequent burping. Abdominal RUQ pain as described above; occasional pain to upper back. Per protocol, emergent symptoms denied; advised appt within 72 hours; patient states she would like to be seen today. Per Epic, all appts full; info to office for appt. MAY REACH PATIENT AT [phone]. Protocol(s) Used: Abdominal Pain Recommended Outcome per Protocol: See Provider within 72 Hours Reason for Outcome: Episodes of abdominal discomfort (feeling fullness/bloating) AND occasional nausea or vomiting, that are increasing in frequency Care Advice: Avoid foods that may be responsible for increased gas production (such as dairy products, raw vegetables, nuts, spicy or fried foods, etc.). ~ ~ Eliminate or decrease the intake of alcoholic or caffeinated beverages. ~ Stop or decrease smoking. Do not take aspirin, ibuprofen, ketoprofen, naproxen, etc., or other pain relieving medications until consulting with provider. ~ ~ Eat smaller, more frequent meals; eat slowly and allow one-half hour to relax after eating. Call provider immediately if develop severe pain, black, tarry stools,  bloody stools, blood-streaked or  coffee ground-looking vomitus, or abdomen swollen. ~ ~ SYMPTOM / CONDITION MANAGEMENT ~ CAUTIONS Discontinue nonprescribed medications and complementary/alternative medications. Continue prescribed medication(s) at ordered dosage/frequency until discussed with provider. ~ 11/15/2011 11:28:41AM Page 1 of 1 [email or web address]_[email or web address]_V2

## 2011-11-15 NOTE — Progress Notes (Signed)
  Subjective:    Patient ID: [name], female    DOB: March 15, 1981, 31 y.o.   MRN: [id number]  HPI  31 yo very pleasant female here for epigastric pain that radiates to mid/right upper quadrant.  Started 4 days ago after eating pizza at work.   Symptoms worsened by eating.  Has felt nauseated but no vomiting. No fevers. No changes in bowel habits or blood in her stool.  None of her coworkers who ate same pizza have any symptoms.  She has a h/o GERD and takes nexium which is not helping.  States symptoms are much different than her typical gerd symptoms.  No dysuria. Sometimes pain radiates to her back.  Patient Active Problem List  Diagnoses  . ALLERGIC RHINITIS  . GERD  . RECTAL BLEEDING  . UTI  . CONTACT DERMATITIS&OTHER ECZEMA DUE UNSPEC CAUSE  . SEBACEOUS CYST  . FATIGUE  . DYSURIA  . HYPOGLYCEMIA, HX OF  . DIABETES MELLITUS, GESTATIONAL, HX OF  . URINARY FREQUENCY  . Right upper quadrant pain   Past Medical History  Diagnosis Date  . GERD (gastroesophageal reflux disease)   . Allergy   . Gestational diabetes    Past Surgical History  Procedure Date  . Cesarean section    History  Substance Use Topics  . Smoking status: Never Smoker   . Smokeless tobacco: Never Used  . Alcohol Use: Yes   Family History  Problem Relation Age of Onset  . Healthy Mother    Allergies  Allergen Reactions  . Codeine    No current outpatient prescriptions on file prior to visit.   The PMH, PSH, Social History, Family History, Medications, and allergies have been reviewed in [facility], and have been updated if relevant.    Review of Systems See HPI     Objective:   Physical Exam BP 100/70  Pulse 72  Temp(Src) 98.4 F (36.9 C) (Oral)  Ht 5\' 4"  (1.626 m)  Wt 122 lb 1.9 oz (55.393 kg)  BMI 20.96 kg/m2  SpO2 100%  General:  Well-developed,well-nourished,in no acute distress; alert,appropriate and cooperative throughout examination Head:  normocephalic and  atraumatic.   Eyes:  vision grossly intact, pupils equal, pupils round, and pupils reactive to light.   Ears:  R ear normal and L ear normal.   Nose:  no external deformity.   Mouth:  good dentition.   Lungs:  Normal respiratory effort, chest expands symmetrically. Lungs are clear to auscultation, no crackles or wheezes. Heart:  Normal rate and regular rhythm. S1 and S2 normal without gallop, murmur, click, rub or other extra sounds. Abdomen:  Bowel sounds positive,abdomen soft, mildly TTP over epigastrium and right upper quadrant +/- murphy's sign, no rebound or guardingNeurologic:  alert & oriented X3 and gait normal.   Skin:  Intact without suspicious lesions or rashes Psych:  Cognition and judgment appear intact. Alert and cooperative with normal attention span and concentration. No apparent delusions, illusions, hallucinations        Assessment & Plan:   1. Right upper quadrant pain  US Abdomen Complete, CBC with Differential, Hepatic Function Panel   New- very consistent with biliary colic.  Non toxic, unlikely cholecystitis but she needs immediate RUQ ultrasound for further evaluation- scheduled at [facility] for 4 pm (I gave my cell phone number for call back).  Will check CBC, LFTs.   The patient indicates understanding of these issues and agrees with the plan.

## 2011-11-18 ENCOUNTER — Ambulatory Visit (INDEPENDENT_AMBULATORY_CARE_PROVIDER_SITE_OTHER): Payer: Commercial Managed Care - PPO | Admitting: General Surgery

## 2011-11-18 ENCOUNTER — Encounter (INDEPENDENT_AMBULATORY_CARE_PROVIDER_SITE_OTHER): Payer: Self-pay | Admitting: General Surgery

## 2011-11-18 VITALS — BP 104/68 | HR 72 | Temp 97.9°F | Resp 16 | Ht 64.0 in | Wt 121.6 lb

## 2011-11-18 DIAGNOSIS — K8020 Calculus of gallbladder without cholecystitis without obstruction: Secondary | ICD-10-CM

## 2011-11-18 NOTE — Progress Notes (Signed)
Patient ID: [name], female   DOB: 01/03/1981, 30 y.o.   MRN: [id number]  Chief Complaint  Patient presents with  . Abdominal Pain    eval GB w/ gallstones    HPI [name] is a 30 y.o. female.   HPI This is a 30-year-old healthy female who presents with about a one-week history of epigastric pain and nausea. This began last Tuesday after eating fatty foods. She has had some significant epigastric pain and nausea and it got worse on Thursday of last week. She has had a lot of trouble with belching since then. She really has not had the pain present since Saturday but she is really not been eating well. She has been eating but has been plain food. She still has some burning in her epigastrium as well as the eructation. She's been having normal bowel movements. She has no prior history of any of these pains. She does have heartburn for which she is on Nexium and she reports that this is completely different from that pain. She has a CT scan to rule out kidney stones a couple years ago which showed gallstones. She was seen and underwent an ultrasound that showed multiple large calcified gallstones. The largest of these is 18.5 mm. Her common bile duct is normal at 3.7 mm. She comes in today to discuss a cholecystectomy. Her liver function tests are all normal.  Past Medical History  Diagnosis Date  . GERD (gastroesophageal reflux disease)   . Allergy   . Gestational diabetes     Past Surgical History  Procedure Date  . Cesarean section 06, 09    Family History  Problem Relation Age of Onset  . Healthy Mother   . Cancer Maternal Grandmother     breast  . Heart disease Paternal Grandmother     Social History History  Substance Use Topics  . Smoking status: Never Smoker   . Smokeless tobacco: Never Used  . Alcohol Use: Yes    Allergies  Allergen Reactions  . Codeine   . Hydrocodone Nausea And Vomiting  . Morphine And Related Hives    Current Outpatient  Prescriptions  Medication Sig Dispense Refill  . desogestrel-ethinyl estradiol (VELIVET,CAZIANT,CESIA,CYCLESSA) 0.1/0.125/0.15 -0.025 MG tablet Take 1 tablet by mouth daily.      . esomeprazole (NEXIUM) 40 MG capsule Take 40 mg by mouth daily before breakfast.        Review of Systems Review of Systems  Constitutional: Negative for fever, chills and unexpected weight change.  HENT: Negative for hearing loss, congestion, sore throat, trouble swallowing and voice change.   Eyes: Negative for visual disturbance.  Respiratory: Negative for cough and wheezing.   Cardiovascular: Negative for chest pain, palpitations and leg swelling.  Gastrointestinal: Positive for nausea and abdominal pain. Negative for vomiting, diarrhea, constipation, blood in stool, abdominal distention and anal bleeding.  Genitourinary: Negative for hematuria, vaginal bleeding and difficulty urinating.  Musculoskeletal: Negative for arthralgias.  Skin: Negative for rash and wound.  Neurological: Negative for seizures, syncope and headaches.  Hematological: Negative for adenopathy. Does not bruise/bleed easily.  Psychiatric/Behavioral: Negative for confusion.    Blood pressure 104/68, pulse 72, temperature 97.9 F (36.6 C), temperature source Temporal, resp. rate 16, height 5' 4" (1.626 m), weight 121 lb 9.6 oz (55.157 kg).  Physical Exam Physical Exam  Vitals reviewed. Constitutional: She appears well-developed and well-nourished.  Eyes: No scleral icterus.  Neck: Neck supple.  Cardiovascular: Normal rate, regular rhythm and normal heart   sounds.   Pulmonary/Chest: Effort normal and breath sounds normal. She has no wheezes. She has no rales.  Abdominal: Soft. Bowel sounds are normal. There is tenderness in the right upper quadrant and epigastric area. There is no rebound and no guarding.  Lymphadenopathy:    She has no cervical adenopathy.    Data Reviewed COMPLETE ABDOMINAL ULTRASOUND  Comparison: CT of the  abdomen and pelvis 02/19/2010  Findings:  Gallbladder: Gallbladder wall is 1.1 mm. Multiple gallstones are  identified. These appear calcified and measure as large as 18.5  mm. No sonographic Murphy's sign identified.  Common bile duct: 3.7 mm  Liver: The liver is homogeneous in echotexture. No focal mass  identified.  IVC: Appears normal.  Pancreas: The visualized portion of the pancreas has a normal  appearance. The tail is difficult to image because of overlying  bowel gas.  Spleen: Measures  Right Kidney: 9.9 cm, normal appearance.  Left Kidney: 9.9 cm. Normal appearance.  Abdominal aorta: 1.7 centimeters.  IMPRESSION:  1. Multiple large calcified gallstones.  2. No other evidence for acute cholecystitis.  3. No evidence for acute abnormality of the upper abdomen.   Assessment   Symptomatic cholelithaisis  Plan    I discussed the procedure in detail.  The patient was given educational material.  We discussed the risks and benefits of a laparoscopic cholecystectomy and possible cholangiogram including, but not limited to bleeding, infection, injury to surrounding structures such as the intestine or liver, bile leak, retained gallstones, need to convert to an open procedure, prolonged diarrhea, blood clots such as  DVT, common bile duct injury, anesthesia risks, and possible need for additional procedures.  The likelihood of improvement in symptoms and return to the patient's normal status is good. We discussed the typical post-operative recovery course.       [place] Gan 11/18/2011, 3:22 PM    

## 2011-11-20 ENCOUNTER — Encounter (HOSPITAL_COMMUNITY): Payer: Self-pay | Admitting: Pharmacy Technician

## 2011-11-20 ENCOUNTER — Encounter (HOSPITAL_COMMUNITY): Payer: Self-pay

## 2011-11-20 ENCOUNTER — Encounter (HOSPITAL_COMMUNITY)
Admission: RE | Admit: 2011-11-20 | Discharge: 2011-11-20 | Disposition: A | Discharge status: Home / Self Care | Payer: 59 | Source: Ambulatory Visit | Attending: General Surgery | Admitting: General Surgery

## 2011-11-20 LAB — SURGICAL PCR SCREEN: Staphylococcus aureus: NEGATIVE

## 2011-11-20 LAB — HCG, SERUM, QUALITATIVE: Preg, Serum: NEGATIVE

## 2011-11-20 NOTE — Patient Instructions (Addendum)
20 Wanell Lorenzi ZOXWR  11/20/2011   Your procedure is scheduled on:  11/22/11   Surgery [id number]    FRIDAY  Report to [facility] at 1230 PM.  Call this number if you have problems the morning of surgery: [phone]     Or PST   [id number]  [facility]   Remember:   Do not eat food:After Midnight. Thursday night  May have clear liquids: until 0630 AM   Friday AM  Clear liquids include soda, tea, black coffee, apple or grape juice, broth.  Take these medicines the morning of surgery with A SIP OF WATER: none   Do not wear jewelry, make-up or nail polish.  Do not wear lotions, powders, or perfumes. You may wear deodorant.  Do not shave 48 hours prior to surgery.  Do not bring valuables to the hospital.  Contacts, dentures or bridgework may not be worn into surgery.  Leave suitcase in the car. After surgery it may be brought to your room.  For patients admitted to the hospital, checkout time is 11:00 AM the day of discharge.   Patients discharged the day of surgery will not be allowed to drive home.  Name and phone number of your driver:  husband                                                                    Special Instructions: CHG Shower Use Special Wash: 1/2 bottle night before surgery and 1/2 bottle morning of surgery. REGULAR SOAP FACE AND PRIVATES              LADIES- NO SHAVING 48 HOURS BEFORE USING BETASEPT SOAP.                Please read over the following fact sheets that you were given: MRSA Information

## 2011-11-20 NOTE — Pre-Procedure Instructions (Signed)
Left voice mail message on cell phone of negative PCR results   And to be at Short Stay 1230PM  Friday

## 2011-11-20 NOTE — Pre-Procedure Instructions (Signed)
CBC and liver panel 11/15/11 EPIC.     Instructed no aspirin, antiinflammatories or vitamins for 5 days pre op

## 2011-11-21 ENCOUNTER — Other Ambulatory Visit (HOSPITAL_COMMUNITY): Payer: Self-pay | Admitting: *Deleted

## 2011-11-21 ENCOUNTER — Other Ambulatory Visit: Payer: Self-pay | Admitting: Family Medicine

## 2011-11-21 ENCOUNTER — Encounter (HOSPITAL_COMMUNITY): Payer: Self-pay | Admitting: Pharmacy Technician

## 2011-11-21 ENCOUNTER — Encounter (HOSPITAL_COMMUNITY): Payer: Self-pay | Admitting: *Deleted

## 2011-11-21 MED ORDER — DEXTROSE 5 % IV SOLN
1.0000 g | INTRAVENOUS | Status: AC
  Administered 2011-11-22: 1 g via INTRAVENOUS
  Filled 2011-11-21: qty 1

## 2011-11-22 ENCOUNTER — Ambulatory Visit (HOSPITAL_COMMUNITY): Payer: 59 | Admitting: Anesthesiology

## 2011-11-22 ENCOUNTER — Encounter (HOSPITAL_COMMUNITY): Payer: Self-pay | Admitting: Anesthesiology

## 2011-11-22 ENCOUNTER — Encounter (HOSPITAL_COMMUNITY): Payer: Self-pay | Admitting: *Deleted

## 2011-11-22 ENCOUNTER — Encounter (HOSPITAL_COMMUNITY): Admission: RE | Payer: Self-pay | Source: Ambulatory Visit

## 2011-11-22 ENCOUNTER — Encounter (HOSPITAL_COMMUNITY)
Admission: RE | Disposition: A | Discharge status: Home / Self Care | Payer: Self-pay | Source: Ambulatory Visit | Attending: General Surgery

## 2011-11-22 ENCOUNTER — Ambulatory Visit (HOSPITAL_COMMUNITY): Admission: RE | Admit: 2011-11-22 | Payer: 59 | Source: Ambulatory Visit | Admitting: General Surgery

## 2011-11-22 ENCOUNTER — Observation Stay (HOSPITAL_COMMUNITY)
Admission: RE | Admit: 2011-11-22 | Discharge: 2011-11-23 | Disposition: A | Discharge status: Home / Self Care | Payer: 59 | Source: Ambulatory Visit | Attending: General Surgery | Admitting: General Surgery

## 2011-11-22 DIAGNOSIS — K8010 Calculus of gallbladder with chronic cholecystitis without obstruction: Principal | ICD-10-CM | POA: Insufficient documentation

## 2011-11-22 DIAGNOSIS — K824 Cholesterolosis of gallbladder: Secondary | ICD-10-CM

## 2011-11-22 DIAGNOSIS — K811 Chronic cholecystitis: Secondary | ICD-10-CM

## 2011-11-22 DIAGNOSIS — K219 Gastro-esophageal reflux disease without esophagitis: Secondary | ICD-10-CM | POA: Insufficient documentation

## 2011-11-22 DIAGNOSIS — Z01812 Encounter for preprocedural laboratory examination: Secondary | ICD-10-CM | POA: Insufficient documentation

## 2011-11-22 HISTORY — PX: CHOLECYSTECTOMY: SHX55

## 2011-11-22 SURGERY — LAPAROSCOPIC CHOLECYSTECTOMY
Anesthesia: General

## 2011-11-22 SURGERY — LAPAROSCOPIC CHOLECYSTECTOMY
Anesthesia: General | Site: Abdomen | Wound class: Clean Contaminated

## 2011-11-22 MED ORDER — SODIUM CHLORIDE 0.9 % IV SOLN
INTRAVENOUS | Status: DC
  Administered 2011-11-23: 07:00:00 via INTRAVENOUS

## 2011-11-22 MED ORDER — ACETAMINOPHEN 325 MG PO TABS: 650.0000 mg | ORAL_TABLET | Freq: Four times a day (QID) | ORAL | Status: DC | PRN

## 2011-11-22 MED ORDER — ONDANSETRON HCL 4 MG/2ML IJ SOLN
INTRAMUSCULAR | Status: AC
  Administered 2011-11-22: 4 mg via INTRAVENOUS
  Filled 2011-11-22: qty 2

## 2011-11-22 MED ORDER — DEXAMETHASONE SODIUM PHOSPHATE 4 MG/ML IJ SOLN
INTRAMUSCULAR | Status: DC | PRN
  Administered 2011-11-22: 4 mg via INTRAVENOUS

## 2011-11-22 MED ORDER — OXYCODONE-ACETAMINOPHEN 5-325 MG PO TABS: 1.0000 | ORAL_TABLET | ORAL | Status: AC | PRN

## 2011-11-22 MED ORDER — SODIUM CHLORIDE 0.9 % IJ SOLN: 3.0000 mL | Freq: Two times a day (BID) | INTRAMUSCULAR | Status: DC

## 2011-11-22 MED ORDER — ACETAMINOPHEN 650 MG RE SUPP: 650.0000 mg | Freq: Four times a day (QID) | RECTAL | Status: DC | PRN

## 2011-11-22 MED ORDER — ONDANSETRON HCL 4 MG/2ML IJ SOLN: 4.0000 mg | Freq: Four times a day (QID) | INTRAMUSCULAR | Status: DC | PRN

## 2011-11-22 MED ORDER — HYDROMORPHONE HCL PF 1 MG/ML IJ SOLN
0.2500 mg | INTRAMUSCULAR | Status: DC | PRN
  Administered 2011-11-22 (×4): 0.5 mg via INTRAVENOUS

## 2011-11-22 MED ORDER — MORPHINE SULFATE 2 MG/ML IJ SOLN: 2.0000 mg | INTRAMUSCULAR | Status: DC | PRN

## 2011-11-22 MED ORDER — NEOSTIGMINE METHYLSULFATE 1 MG/ML IJ SOLN
INTRAMUSCULAR | Status: DC | PRN
  Administered 2011-11-22: 3 mg via INTRAVENOUS

## 2011-11-22 MED ORDER — HYDROMORPHONE HCL PF 1 MG/ML IJ SOLN
0.5000 mg | INTRAMUSCULAR | Status: DC | PRN
  Administered 2011-11-22: 0.5 mg via INTRAVENOUS
  Filled 2011-11-22: qty 1

## 2011-11-22 MED ORDER — ONDANSETRON HCL 4 MG/2ML IJ SOLN
4.0000 mg | Freq: Four times a day (QID) | INTRAMUSCULAR | Status: DC | PRN
  Administered 2011-11-22: 4 mg via INTRAVENOUS

## 2011-11-22 MED ORDER — HYDROMORPHONE HCL PF 1 MG/ML IJ SOLN
INTRAMUSCULAR | Status: AC
  Filled 2011-11-22: qty 1

## 2011-11-22 MED ORDER — PROPOFOL 10 MG/ML IV EMUL
INTRAVENOUS | Status: DC | PRN
  Administered 2011-11-22: 200 mg via INTRAVENOUS

## 2011-11-22 MED ORDER — PROMETHAZINE HCL 25 MG/ML IJ SOLN
INTRAMUSCULAR | Status: AC
  Administered 2011-11-22: 12.5 mg via INTRAVENOUS
  Filled 2011-11-22: qty 1

## 2011-11-22 MED ORDER — ONDANSETRON HCL 4 MG/2ML IJ SOLN
4.0000 mg | Freq: Once | INTRAMUSCULAR | Status: AC | PRN
  Administered 2011-11-22: 4 mg via INTRAVENOUS

## 2011-11-22 MED ORDER — MORPHINE SULFATE 2 MG/ML IJ SOLN: 0.0500 mg/kg | INTRAMUSCULAR | Status: DC | PRN

## 2011-11-22 MED ORDER — ACETAMINOPHEN 325 MG PO TABS: 650.0000 mg | ORAL_TABLET | ORAL | Status: DC | PRN

## 2011-11-22 MED ORDER — BUPIVACAINE-EPINEPHRINE 0.25% -1:200000 IJ SOLN
INTRAMUSCULAR | Status: DC | PRN
  Administered 2011-11-22: 8 mL

## 2011-11-22 MED ORDER — GLYCOPYRROLATE 0.2 MG/ML IJ SOLN
INTRAMUSCULAR | Status: DC | PRN
  Administered 2011-11-22: .5 mg via INTRAVENOUS

## 2011-11-22 MED ORDER — ACETAMINOPHEN 10 MG/ML IV SOLN
INTRAVENOUS | Status: AC
  Filled 2011-11-22: qty 100

## 2011-11-22 MED ORDER — FENTANYL CITRATE 0.05 MG/ML IJ SOLN
INTRAMUSCULAR | Status: DC | PRN
  Administered 2011-11-22 (×4): 50 ug via INTRAVENOUS
  Administered 2011-11-22 (×2): 100 ug via INTRAVENOUS
  Administered 2011-11-22 (×2): 50 ug via INTRAVENOUS

## 2011-11-22 MED ORDER — SODIUM CHLORIDE 0.9 % IV SOLN: INTRAVENOUS | Status: DC

## 2011-11-22 MED ORDER — OXYCODONE HCL 5 MG PO TABS
5.0000 mg | ORAL_TABLET | ORAL | Status: DC | PRN
  Administered 2011-11-23: 5 mg via ORAL
  Filled 2011-11-22: qty 1

## 2011-11-22 MED ORDER — SODIUM CHLORIDE 0.9 % IR SOLN
Status: DC | PRN
  Administered 2011-11-22: 1000 mL

## 2011-11-22 MED ORDER — SODIUM CHLORIDE 0.9 % IJ SOLN: 3.0000 mL | INTRAMUSCULAR | Status: DC | PRN

## 2011-11-22 MED ORDER — SODIUM CHLORIDE 0.9 % IV SOLN: 250.0000 mL | INTRAVENOUS | Status: DC | PRN

## 2011-11-22 MED ORDER — ACETAMINOPHEN 650 MG RE SUPP: 650.0000 mg | RECTAL | Status: DC | PRN

## 2011-11-22 MED ORDER — OXYCODONE HCL 5 MG PO TABS: 5.0000 mg | ORAL_TABLET | ORAL | Status: DC | PRN

## 2011-11-22 MED ORDER — 0.9 % SODIUM CHLORIDE (POUR BTL) OPTIME
TOPICAL | Status: DC | PRN
  Administered 2011-11-22: 1000 mL

## 2011-11-22 MED ORDER — LACTATED RINGERS IV SOLN
INTRAVENOUS | Status: DC | PRN
  Administered 2011-11-22 (×2): via INTRAVENOUS

## 2011-11-22 MED ORDER — MIDAZOLAM HCL 5 MG/5ML IJ SOLN
INTRAMUSCULAR | Status: DC | PRN
  Administered 2011-11-22: 2 mg via INTRAVENOUS

## 2011-11-22 MED ORDER — ONDANSETRON HCL 4 MG/2ML IJ SOLN
INTRAMUSCULAR | Status: AC
  Filled 2011-11-22: qty 2

## 2011-11-22 MED ORDER — ONDANSETRON HCL 4 MG/2ML IJ SOLN
INTRAMUSCULAR | Status: DC | PRN
  Administered 2011-11-22: 4 mg via INTRAVENOUS

## 2011-11-22 MED ORDER — PROMETHAZINE HCL 25 MG/ML IJ SOLN
12.5000 mg | Freq: Four times a day (QID) | INTRAMUSCULAR | Status: DC | PRN
  Administered 2011-11-22: 12.5 mg via INTRAVENOUS

## 2011-11-22 MED ORDER — LACTATED RINGERS IV SOLN
INTRAVENOUS | Status: DC
  Administered 2011-11-22: 12:00:00 via INTRAVENOUS

## 2011-11-22 MED ORDER — ROCURONIUM BROMIDE 100 MG/10ML IV SOLN
INTRAVENOUS | Status: DC | PRN
  Administered 2011-11-22: 40 mg via INTRAVENOUS

## 2011-11-22 MED ORDER — ACETAMINOPHEN 10 MG/ML IV SOLN
INTRAVENOUS | Status: DC | PRN
  Administered 2011-11-22: 1000 mg via INTRAVENOUS

## 2011-11-22 SURGICAL SUPPLY — 46 items
APPLIER CLIP 5 13 M/L LIGAMAX5 (MISCELLANEOUS) ×2
BLADE SURG ROTATE 9660 (MISCELLANEOUS) IMPLANT
CANISTER SUCTION 2500CC (MISCELLANEOUS) ×2 IMPLANT
CATH REDDICK CHOLANGI 4FR 50CM (CATHETERS) IMPLANT
CHLORAPREP W/TINT 26ML (MISCELLANEOUS) ×2 IMPLANT
CLIP APPLIE 5 13 M/L LIGAMAX5 (MISCELLANEOUS) ×1 IMPLANT
CLOTH BEACON ORANGE TIMEOUT ST (SAFETY) ×2 IMPLANT
COVER MAYO STAND STRL (DRAPES) IMPLANT
COVER SURGICAL LIGHT HANDLE (MISCELLANEOUS) ×2 IMPLANT
DECANTER SPIKE VIAL GLASS SM (MISCELLANEOUS) ×2 IMPLANT
DERMABOND ADHESIVE PROPEN (GAUZE/BANDAGES/DRESSINGS) ×1
DERMABOND ADVANCED (GAUZE/BANDAGES/DRESSINGS)
DERMABOND ADVANCED .7 DNX12 (GAUZE/BANDAGES/DRESSINGS) IMPLANT
DERMABOND ADVANCED .7 DNX6 (GAUZE/BANDAGES/DRESSINGS) ×1 IMPLANT
DRAPE C-ARM 42X72 X-RAY (DRAPES) IMPLANT
ELECT REM PT RETURN 9FT ADLT (ELECTROSURGICAL) ×2
ELECTRODE REM PT RTRN 9FT ADLT (ELECTROSURGICAL) ×1 IMPLANT
GLOVE BIO SURGEON STRL SZ7 (GLOVE) ×2 IMPLANT
GLOVE BIOGEL PI IND STRL 7.0 (GLOVE) ×1 IMPLANT
GLOVE BIOGEL PI IND STRL 7.5 (GLOVE) ×1 IMPLANT
GLOVE BIOGEL PI INDICATOR 7.0 (GLOVE) ×1
GLOVE BIOGEL PI INDICATOR 7.5 (GLOVE) ×1
GLOVE ECLIPSE 6.5 STRL STRAW (GLOVE) ×2 IMPLANT
GLOVE SS BIOGEL STRL SZ 8 (GLOVE) ×1 IMPLANT
GLOVE SUPERSENSE BIOGEL SZ 8 (GLOVE) ×1
GLOVE SURG SS PI 6.5 STRL IVOR (GLOVE) ×2 IMPLANT
GOWN PREVENTION PLUS XLARGE (GOWN DISPOSABLE) ×2 IMPLANT
GOWN STRL NON-REIN LRG LVL3 (GOWN DISPOSABLE) ×6 IMPLANT
IV CATH 14GX2 1/4 (CATHETERS) IMPLANT
KIT BASIN OR (CUSTOM PROCEDURE TRAY) ×2 IMPLANT
KIT ROOM TURNOVER OR (KITS) ×2 IMPLANT
NS IRRIG 1000ML POUR BTL (IV SOLUTION) ×2 IMPLANT
PAD ARMBOARD 7.5X6 YLW CONV (MISCELLANEOUS) ×4 IMPLANT
POUCH SPECIMEN RETRIEVAL 10MM (ENDOMECHANICALS) ×2 IMPLANT
SCISSORS LAP 5X35 DISP (ENDOMECHANICALS) IMPLANT
SET CHOLANGIOGRAPH 5 50 .035 (SET/KITS/TRAYS/PACK) IMPLANT
SET IRRIG TUBING LAPAROSCOPIC (IRRIGATION / IRRIGATOR) ×2 IMPLANT
SLEEVE ENDOPATH XCEL 5M (ENDOMECHANICALS) ×4 IMPLANT
SPECIMEN JAR SMALL (MISCELLANEOUS) ×2 IMPLANT
SUT MNCRL AB 4-0 PS2 18 (SUTURE) ×2 IMPLANT
TOWEL OR 17X24 6PK STRL BLUE (TOWEL DISPOSABLE) ×2 IMPLANT
TOWEL OR 17X26 10 PK STRL BLUE (TOWEL DISPOSABLE) ×2 IMPLANT
TRAY LAPAROSCOPIC (CUSTOM PROCEDURE TRAY) ×2 IMPLANT
TROCAR XCEL BLUNT TIP 100MML (ENDOMECHANICALS) ×2 IMPLANT
TROCAR XCEL NON-BLD 5MMX100MML (ENDOMECHANICALS) ×2 IMPLANT
WATER STERILE IRR 1000ML POUR (IV SOLUTION) IMPLANT

## 2011-11-22 NOTE — Interval H&P Note (Signed)
History and Physical Interval Note:  11/22/2011 12:39 PM  [name]  has presented today for surgery, with the diagnosis of cholelithiaisis   The various methods of treatment have been discussed with the patient and family. After consideration of risks, benefits and other options for treatment, the patient has consented to  Procedure(s) (LRB): LAPAROSCOPIC CHOLECYSTECTOMY (N/A) as a surgical intervention .  The patients' history has been reviewed, patient examined, no change in status, stable for surgery.  I have reviewed the patients' chart and labs.  Questions were answered to the patient's satisfaction.     [name]

## 2011-11-22 NOTE — Op Note (Signed)
Preoperative diagnosis: Symptomatic cholelithiasis Postoperative diagnosis: Symptomatic cholelithiasis and chronic cholecystitis Procedure: Laparoscopic cholecystectomy Surgeon: Dr. Harden Mo Asst.: Brayton El Specimens: Gallbladder to pathology Estimated blood loss: Minimal Complications: None Drains: None Sponge needle count correct x2 at end of operation Disposition patient to recovery in stable condition  Indications: This is a healthy 31 year old female who presents with right upper quadrant pain, nausea, and an ultrasound showing cholelithiasis. She clearly had biliary colic. We discussed a laparoscopic cholecystectomy.  Procedure: After informed consent was obtained the patient was taken to the operating room. She was placed under general anesthesia without complication after administration of 1 g of intravenous cefoxitin and sequential compression devices were placed on her legs. Her abdomen was then prepped and draped in the standard sterile surgical fashion. A surgical timeout was performed.  I infiltrated quarter percent Marcaine below her umbilicus. I then made a vertical incision with an 11 blade. I then used a Kelly clamp to divide the subcutaneous tissues. I grasped her umbilical stalk with a Kocher clamp. I then incised her fascia with an 11 blade. I entered the peritoneum bluntly with a Kelly clamp. I placed a 0 Vicryl pursetring sutures to the fascia. I then inserted a Hassan trocar and insufflated the abdomen to 15 mm mercury pressure. I then placed 3 further 5 mm trocars in the epigastrium and right upper quadrant under direct vision after filtration with local anesthetic without complication. The gallbladder was retracted cephalad. She was noted to have some adhesions from her omentum as well as her duodenum. These were taken down bluntly. The gallbladder was then retracted cephalad and lateral. I dissected the triangle of Calot did have some scar tissue present. I  eventually was able to obtain the critical view of safety. I then clipped and divided her cystic duct and cystic artery. She had a small posterior branch of the cystic artery was treated in a similar fashion. I then removed the gallbladder from the liver bed with cautery. This was very stuck to her liver bed. I did in the gallbladder with spillage of a small amount of bile but no stones. The gallbladder was then removed from the liver bed and placed in an Endo Catch bag. This was then removed from the umbilicus. Irrigation was performed until this was clear. Hemostasis was obtained. I then removed the [facility] trocar and tied my pursestring suture down. I viewed this from the epigastric port and there was no evidence of an entry injury and this completely obliterated the defect. I then desufflated the abdomen removed all trocars. The incisions were closed for Monocryl and Dermabond. She tolerated this well was extubated and transferred to recovery in stable condition.

## 2011-11-22 NOTE — Progress Notes (Signed)
Pt resting.  States nausea is better.  Report called to Kristy,R.N. On 5100.  Pt taken to 5150.

## 2011-11-22 NOTE — Discharge Instructions (Signed)
[facility] LAPAROSCOPIC SURGERY: POST OP INSTRUCTIONS  Always review your discharge instruction sheet given to you by the facility where your surgery was performed. IF YOU HAVE DISABILITY OR FAMILY LEAVE FORMS, YOU MUST BRING THEM TO THE OFFICE FOR PROCESSING.   DO NOT GIVE THEM TO YOUR DOCTOR.  1. A prescription for pain medication may be given to you upon discharge.  Take your pain medication as prescribed, if needed.  If narcotic pain medicine is not needed, then you may take acetaminophen (Tylenol), naprosyn (Alleve), or ibuprofen (Advil) as needed. 2. Take your usually prescribed medications unless otherwise directed. 3. If you need a refill on your pain medication, please contact your pharmacy.  They will contact our office to request authorization. Prescriptions will not be filled after 5pm or on week-ends. 4. You should follow a light diet the first few days after arrival home, such as soup and crackers, etc.  Be sure to include lots of fluids daily. 5. Most patients will experience some swelling and bruising in the area of the incisions.  Ice packs will help.  Swelling and bruising can take several days to resolve.  6. It is common to experience some constipation if taking pain medication after surgery.  Increasing fluid intake and taking a stool softener (such as Colace) will usually help or prevent this problem from occurring.  A mild laxative (Milk of Magnesia or Miralax) should be taken according to package instructions if there are no bowel movements after 48 hours. 7. Unless discharge instructions indicate otherwise, you may remove your bandages 48 hours after surgery, and you may shower at that time.  You may have steri-strips (small skin tapes) in place directly over the incision.  These strips should be left on the skin for 7-10 days.  If your surgeon used skin glue on the incision, you may shower in 24 hours.  The glue will flake  off over the next 2-3 weeks.  Any sutures or staples will be removed at the office during your follow-up visit. 8. ACTIVITIES:  You may resume regular (light) daily activities beginning the next day--such as daily self-care, walking, climbing stairs--gradually increasing activities as tolerated.  You may have sexual intercourse when it is comfortable.  Refrain from any heavy lifting or straining until approved by your doctor. a. You may drive when you are no longer taking prescription pain medication, you can comfortably wear a seatbelt, and you can safely maneuver your car and apply brakes. b. RETURN TO WORK:  __________________________________________________________ 9. You should see your doctor in the office for a follow-up appointment approximately 2-3 weeks after your surgery.  Make sure that you call for this appointment within a day or two after you arrive home to insure a convenient appointment time. 10. OTHER INSTRUCTIONS: __________________________________________________________________________________________________________________________ __________________________________________________________________________________________________________________________ WHEN TO CALL YOUR DOCTOR: 1. Fever over 101.0 2. Inability to urinate 3. Continued bleeding from incision. 4. Increased pain, redness, or drainage from the incision. 5. Increasing abdominal pain  The clinic staff is available to answer your questions during regular business hours.  Please don't hesitate to call and ask to speak to one of the nurses for clinical concerns.  If you have a medical emergency, go to the nearest emergency room or call 911.  A surgeon from [facility] is always on call at the hospital. [address], [address], [place], [place]  [address] ? [address] [address], [place], [place]   [address] [phone] ? [phone] ? [phone]   [phone] Web site: [email or web address]  

## 2011-11-22 NOTE — Anesthesia Preprocedure Evaluation (Signed)
Anesthesia Evaluation  Patient identified by MRN, date of birth, ID band Patient awake    Reviewed: Allergy & Precautions, H&P , NPO status   Airway Mallampati: I TM Distance: >3 FB Neck ROM: Full    Dental  (+) Teeth Intact and Dental Advisory Given   Pulmonary  breath sounds clear to auscultation        Cardiovascular Rhythm:Regular Rate:Normal     Neuro/Psych    GI/Hepatic GERD-  Medicated and Controlled,  Endo/Other    Renal/GU      Musculoskeletal   Abdominal   Peds  Hematology   Anesthesia Other Findings   Reproductive/Obstetrics                           Anesthesia Physical Anesthesia Plan  ASA: II  Anesthesia Plan:    Post-op Pain Management:    Induction: Intravenous  Airway Management Planned: Oral ETT  Additional Equipment:   Intra-op Plan:   Post-operative Plan: Extubation in OR  Informed Consent:   Dental advisory given  Plan Discussed with: CRNA, Anesthesiologist and Surgeon  Anesthesia Plan Comments:         Anesthesia Quick Evaluation

## 2011-11-22 NOTE — Anesthesia Postprocedure Evaluation (Signed)
  Anesthesia Post-op Note  Patient: [name]  Procedure(s) Performed: Procedure(s) (LRB): LAPAROSCOPIC CHOLECYSTECTOMY (N/A)  Patient Location: PACU  Anesthesia Type: General  Level of Consciousness: awake, alert  and oriented  Airway and Oxygen Therapy: Patient Spontanous Breathing and Patient connected to nasal cannula oxygen  Post-op Pain: mild  Post-op Assessment: Post-op Vital signs reviewed, Patient's Cardiovascular Status Stable, Respiratory Function Stable, Patent Airway, No signs of Nausea or vomiting and Pain level controlled  Post-op Vital Signs: Reviewed and stable  Complications: No apparent anesthesia complications

## 2011-11-22 NOTE — Transfer of Care (Signed)
Immediate Anesthesia Transfer of Care Note  Patient: [name]  Procedure(s) Performed: Procedure(s) (LRB): LAPAROSCOPIC CHOLECYSTECTOMY (N/A)  Patient Location: PACU  Anesthesia Type: General  Level of Consciousness: oriented and patient cooperative  Airway & Oxygen Therapy: Patient Spontanous Breathing and Patient connected to nasal cannula oxygen  Post-op Assessment: Report given to PACU RN, Post -op Vital signs reviewed and stable and Patient moving all extremities X 4  Post vital signs: Reviewed  Complications: No apparent anesthesia complications

## 2011-11-22 NOTE — Preoperative (Signed)
Beta Blockers   Reason not to administer Beta Blockers:Not Applicable 

## 2011-11-22 NOTE — Progress Notes (Addendum)
When attempting to get pt in [facility] for transport, pt started vomiting.  Dr. Dwain Sarna notified and put in order for Phenergan which was given to pt.  Report updated with pt's RN Wilkie Aye on 5100.  Will wait until nausea resolved for transport.

## 2011-11-22 NOTE — Progress Notes (Signed)
Paged Dr Dwain Sarna regarding pt's nausea.  Pt pale, and unable to keep anything down. States her pain is 3-4 out of 10 but her nausea is the problem.  Pt had 4mg  zofran at 1630 in PACU.  Dr. Dwain Sarna gave orders to admit pt over night

## 2011-11-22 NOTE — H&P (View-Only) (Signed)
Patient ID: [name], female   DOB: [birth year], 31 y.o.   MRN: [id number]  Chief Complaint  Patient presents with  . Abdominal Pain    eval GB w/ gallstones    HPI [name] is a 31 y.o. female.   HPI This is a 31 year old healthy female who presents with about a one-week history of epigastric pain and nausea. This began last Tuesday after eating fatty foods. She has had some significant epigastric pain and nausea and it got worse on Thursday of last week. She has had a lot of trouble with belching since then. She really has not had the pain present since Saturday but she is really not been eating well. She has been eating but has been plain food. She still has some burning in her epigastrium as well as the eructation. She's been having normal bowel movements. She has no prior history of any of these pains. She does have heartburn for which she is on Nexium and she reports that this is completely different from that pain. She has a CT scan to rule out kidney stones a couple years ago which showed gallstones. She was seen and underwent an ultrasound that showed multiple large calcified gallstones. The largest of these is 18.5 mm. Her common bile duct is normal at 3.7 mm. She comes in today to discuss a cholecystectomy. Her liver function tests are all normal.  Past Medical History  Diagnosis Date  . GERD (gastroesophageal reflux disease)   . Allergy   . Gestational diabetes     Past Surgical History  Procedure Date  . Cesarean section 06, 09    Family History  Problem Relation Age of Onset  . Healthy Mother   . Cancer Maternal Grandmother     breast  . Heart disease Paternal Grandmother     Social History History  Substance Use Topics  . Smoking status: Never Smoker   . Smokeless tobacco: Never Used  . Alcohol Use: Yes    Allergies  Allergen Reactions  . Codeine   . Hydrocodone Nausea And Vomiting  . Morphine And Related Hives    Current Outpatient  Prescriptions  Medication Sig Dispense Refill  . desogestrel-ethinyl estradiol (VELIVET,CAZIANT,CESIA,CYCLESSA) 0.1/0.125/0.15 -0.025 MG tablet Take 1 tablet by mouth daily.      Marland Kitchen esomeprazole (NEXIUM) 40 MG capsule Take 40 mg by mouth daily before breakfast.        Review of Systems Review of Systems  Constitutional: Negative for fever, chills and unexpected weight change.  HENT: Negative for hearing loss, congestion, sore throat, trouble swallowing and voice change.   Eyes: Negative for visual disturbance.  Respiratory: Negative for cough and wheezing.   Cardiovascular: Negative for chest pain, palpitations and leg swelling.  Gastrointestinal: Positive for nausea and abdominal pain. Negative for vomiting, diarrhea, constipation, blood in stool, abdominal distention and anal bleeding.  Genitourinary: Negative for hematuria, vaginal bleeding and difficulty urinating.  Musculoskeletal: Negative for arthralgias.  Skin: Negative for rash and wound.  Neurological: Negative for seizures, syncope and headaches.  Hematological: Negative for adenopathy. Does not bruise/bleed easily.  Psychiatric/Behavioral: Negative for confusion.    Blood pressure 104/68, pulse 72, temperature 97.9 F (36.6 C), temperature source Temporal, resp. rate 16, height 5\' 4"  (1.626 m), weight 121 lb 9.6 oz (55.157 kg).  Physical Exam Physical Exam  Vitals reviewed. Constitutional: She appears well-developed and well-nourished.  Eyes: No scleral icterus.  Neck: Neck supple.  Cardiovascular: Normal rate, regular rhythm and normal heart  sounds.   Pulmonary/Chest: Effort normal and breath sounds normal. She has no wheezes. She has no rales.  Abdominal: Soft. Bowel sounds are normal. There is tenderness in the right upper quadrant and epigastric area. There is no rebound and no guarding.  Lymphadenopathy:    She has no cervical adenopathy.    Data Reviewed COMPLETE ABDOMINAL ULTRASOUND  Comparison: CT of the  abdomen and pelvis 02/19/2010  Findings:  Gallbladder: Gallbladder wall is 1.1 mm. Multiple gallstones are  identified. These appear calcified and measure as large as 18.5  mm. No sonographic Murphy's sign identified.  Common bile duct: 3.7 mm  Liver: The liver is homogeneous in echotexture. No focal mass  identified.  IVC: Appears normal.  Pancreas: The visualized portion of the pancreas has a normal  appearance. The tail is difficult to image because of overlying  bowel gas.  Spleen: Measures  Right Kidney: 9.9 cm, normal appearance.  Left Kidney: 9.9 cm. Normal appearance.  Abdominal aorta: 1.7 centimeters.  IMPRESSION:  1. Multiple large calcified gallstones.  2. No other evidence for acute cholecystitis.  3. No evidence for acute abnormality of the upper abdomen.   Assessment   Symptomatic cholelithaisis  Plan    I discussed the procedure in detail.  The patient was given Agricultural engineer.  We discussed the risks and benefits of a laparoscopic cholecystectomy and possible cholangiogram including, but not limited to bleeding, infection, injury to surrounding structures such as the intestine or liver, bile leak, retained gallstones, need to convert to an open procedure, prolonged diarrhea, blood clots such as  DVT, common bile duct injury, anesthesia risks, and possible need for additional procedures.  The likelihood of improvement in symptoms and return to the patient's normal status is good. We discussed the typical post-operative recovery course.       [name] 11/18/2011, 3:22 PM

## 2011-11-23 ENCOUNTER — Other Ambulatory Visit (INDEPENDENT_AMBULATORY_CARE_PROVIDER_SITE_OTHER): Payer: Self-pay | Admitting: General Surgery

## 2011-11-23 DIAGNOSIS — Z09 Encounter for follow-up examination after completed treatment for conditions other than malignant neoplasm: Secondary | ICD-10-CM

## 2011-11-23 MED ORDER — PROMETHAZINE HCL 12.5 MG PO TABS: 12.5000 mg | ORAL_TABLET | Freq: Four times a day (QID) | ORAL | Status: AC | PRN

## 2011-11-23 NOTE — Discharge Summary (Signed)
Physician Discharge Summary  Patient ID: SERENA PETTERSON MRN: [id number] DOB/AGE: Jun 21, 1981 30 y.o.  Admit date: 11/22/2011 Discharge date: 11/23/2011  Admission Diagnoses: Symptomatic cholelithiasis  Discharge Diagnoses:  Active Problems:  * No active hospital problems. *    Discharged Condition: good  Hospital Course: 39 yof with symptomatic cholelithiasis who underwent uneventful laparoscopic cholecystectomy.  Postoperatively she was nauseated and remained in hospital overnight.  Today she feels much better.  She is voiding, tolerating clears, pain controlled.  Will discharge to home today.  Consults: None  Significant Diagnostic Studies: none  Treatments: surgery: lap chole  Discharge Exam: Blood pressure 94/52, pulse 69, temperature 98.3 F (36.8 C), temperature source Oral, resp. rate 12, height 5\' 4"  (1.626 m), weight 122 lb (55.339 kg), last menstrual period 11/08/2011, SpO2 100.00%. abdomen soft, wounds clean without infection  Disposition:    Medication List  As of 11/23/2011  7:34 AM   TAKE these medications         desogestrel-ethinyl estradiol 0.1/0.125/0.15 -0.025 MG tablet   Commonly known as: VELIVET,CAZIANT,CESIA,CYCLESSA   Take 1 tablet by mouth at bedtime.      esomeprazole 40 MG capsule   Commonly known as: NEXIUM   Take 40 mg by mouth at bedtime.      oxyCODONE-acetaminophen 5-325 MG per tablet   Commonly known as: PERCOCET   Take 1 tablet by mouth every 4 (four) hours as needed for pain.      oxyCODONE-acetaminophen 5-325 MG per tablet   Commonly known as: PERCOCET   Take 1 tablet by mouth every 4 (four) hours as needed for pain.           Follow-up Information    Follow up with [facility], [place]. Schedule an appointment as soon as possible for a visit in 3 weeks.   Contact information:   [employer] [address] [place] [address] [phone]           Signed: Emelia Loron 11/23/2011, 7:34 AM

## 2011-11-23 NOTE — Progress Notes (Signed)
Pt discharged to home accomp by husband.  All discharge instructions reviewed and Rx given for percocet, pt has called in a Rx for phenergan [facility] if needed for nausea.  Pt knows to call for appt for FU in 3 weeks Dr. Dwain Sarna.  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

## 2011-11-25 ENCOUNTER — Encounter (HOSPITAL_COMMUNITY): Payer: Self-pay | Admitting: General Surgery

## 2011-11-25 NOTE — Progress Notes (Signed)
UR of chart complete.  

## 2011-11-27 ENCOUNTER — Telehealth (INDEPENDENT_AMBULATORY_CARE_PROVIDER_SITE_OTHER): Payer: Self-pay | Admitting: General Surgery

## 2011-12-04 ENCOUNTER — Telehealth (INDEPENDENT_AMBULATORY_CARE_PROVIDER_SITE_OTHER): Payer: Self-pay

## 2011-12-04 NOTE — Telephone Encounter (Signed)
LMOM for pt to call me so I can see how she is doing after her surgery.

## 2011-12-05 ENCOUNTER — Telehealth (INDEPENDENT_AMBULATORY_CARE_PROVIDER_SITE_OTHER): Payer: Self-pay | Admitting: General Surgery

## 2011-12-05 NOTE — Telephone Encounter (Signed)
Patient left voicemail returning Alisha's call. She called yesterday to check on patient after surgery. Patient states she is doing well. She has post op appt scheduled and will call with any problems prior.

## 2012-01-03 ENCOUNTER — Encounter (INDEPENDENT_AMBULATORY_CARE_PROVIDER_SITE_OTHER): Payer: Self-pay | Admitting: General Surgery

## 2012-01-03 ENCOUNTER — Ambulatory Visit (INDEPENDENT_AMBULATORY_CARE_PROVIDER_SITE_OTHER): Payer: Commercial Managed Care - PPO | Admitting: General Surgery

## 2012-01-03 VITALS — BP 102/80 | HR 80 | Temp 98.6°F | Resp 12 | Ht 62.0 in | Wt 122.0 lb

## 2012-01-03 DIAGNOSIS — Z09 Encounter for follow-up examination after completed treatment for conditions other than malignant neoplasm: Secondary | ICD-10-CM

## 2012-01-03 NOTE — Progress Notes (Signed)
Subjective:     Patient ID: [name], female   DOB: 1980/12/04, 31 y.o.   MRN: [id number]  HPI This is a 31 year old female who I did a laparoscopic cholecystectomy on for chronic cholecystitis. Her pathology showed chronic cholecystitis, cholesterolosis, and cholelithiasis. Since her operation she did very well with resolution of her preoperative complaints. She also has stopped taking her reflux medications and is no longer needing these. She complains of only some incisional pain near her umbilicus but this is now nearly gone.  Review of Systems     Objective:   Physical Exam Healing lap chole incisions without infection    Assessment:     S/p lap chole    Plan:     Return to full activity and may eat what she wants See me as needed

## 2012-04-07 ENCOUNTER — Encounter: Payer: Self-pay | Admitting: Family Medicine

## 2012-04-07 ENCOUNTER — Ambulatory Visit (INDEPENDENT_AMBULATORY_CARE_PROVIDER_SITE_OTHER): Payer: 59 | Admitting: Family Medicine

## 2012-04-07 VITALS — BP 117/70 | HR 70 | Temp 98.2°F | Ht 64.0 in | Wt 126.8 lb

## 2012-04-07 DIAGNOSIS — N390 Urinary tract infection, site not specified: Secondary | ICD-10-CM

## 2012-04-07 LAB — POCT URINALYSIS DIPSTICK
Bilirubin, UA: NEGATIVE
Ketones, UA: NEGATIVE
Protein, UA: NEGATIVE
Spec Grav, UA: <=1.005

## 2012-04-07 MED ORDER — SULFAMETHOXAZOLE-TRIMETHOPRIM 800-160 MG PO TABS: 1.0000 | ORAL_TABLET | Freq: Two times a day (BID) | ORAL | Status: AC

## 2012-04-07 NOTE — Assessment & Plan Note (Signed)
Uncomplicated  tx with septra bid for 5 days cx urine  Red flags disc for pyelo - will call if not imp in 2 d or worse Disc ways to prev utis also

## 2012-04-07 NOTE — Patient Instructions (Addendum)
Drink lots of water  Take septra as directed  We will call about urine culture when it returns  if worse or not starting to improve in 2 days please call

## 2012-04-07 NOTE — Progress Notes (Signed)
Subjective:    Patient ID: [name], female    DOB: 12/14/1980, 31 y.o.   MRN: [id number]  HPI Thinks she has a uti  Burning to urinate and discomfort around bladder  No n/v or back pain  No blood in urine   No missed OC --not pregnant   Started yesterday with symptoms   Is drinking lots of water    Patient Active Problem List  Diagnosis  . ALLERGIC RHINITIS  . GERD  . RECTAL BLEEDING  . UTI  . CONTACT DERMATITIS&OTHER ECZEMA DUE UNSPEC CAUSE  . SEBACEOUS CYST  . FATIGUE  . DYSURIA  . HYPOGLYCEMIA, HX OF  . DIABETES MELLITUS, GESTATIONAL, HX OF  . URINARY FREQUENCY  . Right upper quadrant pain   Past Medical History  Diagnosis Date  . Allergy   . Gestational diabetes   . GERD (gastroesophageal reflux disease)   . Allergic rhinitis    Past Surgical History  Procedure Date  . Cesarean section 06, 09  . Cholecystectomy 11/22/2011    Procedure: LAPAROSCOPIC CHOLECYSTECTOMY;  Surgeon: Emelia Loron, MD;  Location: [facility] OR;  Service: General;  Laterality: N/A;   History  Substance Use Topics  . Smoking status: Never Smoker   . Smokeless tobacco: Never Used  . Alcohol Use: Yes     socially   Family History  Problem Relation Age of Onset  . Healthy Mother   . Cancer Maternal Grandmother     breast  . Heart disease Paternal Grandmother    Allergies  Allergen Reactions  . Codeine Nausea And Vomiting  . Hydrocodone Nausea And Vomiting  . Morphine And Related Hives and Itching   Current Outpatient Prescriptions on File Prior to Visit  Medication Sig Dispense Refill  . desogestrel-ethinyl estradiol (VELIVET,CAZIANT,CESIA,CYCLESSA) 0.1/0.125/0.15 -0.025 MG tablet Take 1 tablet by mouth at bedtime.       Marland Kitchen esomeprazole (NEXIUM) 40 MG capsule Take 40 mg by mouth at bedtime.          Review of Systems Review of Systems  Constitutional: Negative for fever, appetite change, fatigue and unexpected weight change.  Eyes: Negative for pain and visual  disturbance.  Respiratory: Negative for cough and shortness of breath.   Cardiovascular: Negative for cp or palpitations    Gastrointestinal: Negative for nausea, diarrhea and constipation.  Genitourinary:pos for urgency and frequency. pos for dysuria and bladder discomfort, neg for blood in urine or back pain  Skin: Negative for pallor or rash   Neurological: Negative for weakness, light-headedness, numbness and headaches.  Hematological: Negative for adenopathy. Does not bruise/bleed easily.  Psychiatric/Behavioral: Negative for dysphoric mood. The patient is not nervous/anxious.         Objective:   Physical Exam  Constitutional: She appears well-developed and well-nourished. No distress.  HENT:  Head: Normocephalic and atraumatic.  Mouth/Throat: Oropharynx is clear and moist.  Eyes: Conjunctivae and EOM are normal. Pupils are equal, round, and reactive to light. No scleral icterus.  Neck: Normal range of motion. Neck supple. No JVD present. No thyromegaly present.  Cardiovascular: Normal rate and regular rhythm.   Pulmonary/Chest: Effort normal and breath sounds normal.  Abdominal: Soft. Bowel sounds are normal. She exhibits no distension and no mass.       Mild suprapubic tenderness   Musculoskeletal:       No cva tenderness   Lymphadenopathy:    She has no cervical adenopathy.  Neurological: She is alert.  Skin: Skin is warm and dry. No rash  noted.  Psychiatric: She has a normal mood and affect.          Assessment & Plan:

## 2012-04-09 LAB — URINE CULTURE: Colony Count: NO GROWTH

## 2012-06-03 ENCOUNTER — Encounter: Payer: Self-pay | Admitting: Family Medicine

## 2012-06-03 ENCOUNTER — Ambulatory Visit (INDEPENDENT_AMBULATORY_CARE_PROVIDER_SITE_OTHER): Payer: 59 | Admitting: Family Medicine

## 2012-06-03 VITALS — BP 116/70 | HR 78 | Temp 98.3°F | Ht 63.0 in | Wt 126.2 lb

## 2012-06-03 DIAGNOSIS — M545 Low back pain: Secondary | ICD-10-CM

## 2012-06-03 LAB — POCT URINALYSIS DIPSTICK
Glucose, UA: NEGATIVE
Leukocytes, UA: NEGATIVE
Nitrite, UA: NEGATIVE
Protein, UA: NEGATIVE
Spec Grav, UA: 1.015
Urobilinogen, UA: 0.2

## 2012-06-03 MED ORDER — MELOXICAM 15 MG PO TABS: 15.0000 mg | ORAL_TABLET | Freq: Every day | ORAL | Status: DC

## 2012-06-03 NOTE — Assessment & Plan Note (Signed)
Right low back pain - worse with extension, and noted loss of lordosis on exam - suspect lumbosacral sprain/ strain Handout given Trial of meloxicam with food daily for 1-2 weeks  Heat/ stretching  ua done

## 2012-06-03 NOTE — Progress Notes (Signed)
Subjective:    Patient ID: [name], female    DOB: 01-14-1981, 31 y.o.   MRN: [id number]  HPI Here for lower back pain since 9/13 Right lower back  At the end of the day was achey -- then started to happen more often Yesterday- constant pain / dull , occ sharp moments   Yesterday - took some ibuprofen  ? What position is worse  Not as bad at night   No radiation to legs, no weakness or numbness   No urinary symptoms at all   Patient Active Problem List  Diagnosis  . ALLERGIC RHINITIS  . GERD  . RECTAL BLEEDING  . UTI  . CONTACT DERMATITIS&OTHER ECZEMA DUE UNSPEC CAUSE  . SEBACEOUS CYST  . FATIGUE  . DYSURIA  . HYPOGLYCEMIA, HX OF  . DIABETES MELLITUS, GESTATIONAL, HX OF  . URINARY FREQUENCY  . Right upper quadrant pain   Past Medical History  Diagnosis Date  . Allergy   . Gestational diabetes   . GERD (gastroesophageal reflux disease)   . Allergic rhinitis    Past Surgical History  Procedure Date  . Cesarean section 06, 09  . Cholecystectomy 11/22/2011    Procedure: LAPAROSCOPIC CHOLECYSTECTOMY;  Surgeon: Emelia Loron, MD;  Location: [facility] OR;  Service: General;  Laterality: N/A;   History  Substance Use Topics  . Smoking status: Never Smoker   . Smokeless tobacco: Never Used  . Alcohol Use: Yes     socially   Family History  Problem Relation Age of Onset  . Healthy Mother   . Cancer Maternal Grandmother     breast  . Heart disease Paternal Grandmother    Allergies  Allergen Reactions  . Codeine Nausea And Vomiting  . Hydrocodone Nausea And Vomiting  . Morphine And Related Hives and Itching   Current Outpatient Prescriptions on File Prior to Visit  Medication Sig Dispense Refill  . desogestrel-ethinyl estradiol (VELIVET,CAZIANT,CESIA,CYCLESSA) 0.1/0.125/0.15 -0.025 MG tablet Take 1 tablet by mouth at bedtime.       Marland Kitchen esomeprazole (NEXIUM) 40 MG capsule Take 40 mg by mouth at bedtime.        Patient Active Problem List  Diagnosis  .  ALLERGIC RHINITIS  . GERD  . RECTAL BLEEDING  . UTI  . CONTACT DERMATITIS&OTHER ECZEMA DUE UNSPEC CAUSE  . SEBACEOUS CYST  . FATIGUE  . DYSURIA  . HYPOGLYCEMIA, HX OF  . DIABETES MELLITUS, GESTATIONAL, HX OF  . URINARY FREQUENCY  . Right upper quadrant pain   Past Medical History  Diagnosis Date  . Allergy   . Gestational diabetes   . GERD (gastroesophageal reflux disease)   . Allergic rhinitis    Past Surgical History  Procedure Date  . Cesarean section 06, 09  . Cholecystectomy 11/22/2011    Procedure: LAPAROSCOPIC CHOLECYSTECTOMY;  Surgeon: Emelia Loron, MD;  Location: [facility] OR;  Service: General;  Laterality: N/A;   History  Substance Use Topics  . Smoking status: Never Smoker   . Smokeless tobacco: Never Used  . Alcohol Use: Yes     socially   Family History  Problem Relation Age of Onset  . Healthy Mother   . Cancer Maternal Grandmother     breast  . Heart disease Paternal Grandmother    Allergies  Allergen Reactions  . Codeine Nausea And Vomiting  . Hydrocodone Nausea And Vomiting  . Morphine And Related Hives and Itching   Current Outpatient Prescriptions on File Prior to Visit  Medication Sig Dispense  Refill  . desogestrel-ethinyl estradiol (VELIVET,CAZIANT,CESIA,CYCLESSA) 0.1/0.125/0.15 -0.025 MG tablet Take 1 tablet by mouth at bedtime.       Marland Kitchen esomeprazole (NEXIUM) 40 MG capsule Take 40 mg by mouth at bedtime.          Review of Systems Review of Systems  Constitutional: Negative for fever, appetite change, fatigue and unexpected weight change.  Eyes: Negative for pain and visual disturbance.  Respiratory: Negative for cough and shortness of breath.   Cardiovascular: Negative for cp or palpitations    Gastrointestinal: Negative for nausea, diarrhea and constipation.  Genitourinary: Negative for urgency and frequency.  Skin: Negative for pallor or rash   MSK pos for low back pain without radiation, neg for joint swelling   Neurological:  Negative for weakness, light-headedness, numbness and headaches.  Hematological: Negative for adenopathy. Does not bruise/bleed easily.  Psychiatric/Behavioral: Negative for dysphoric mood. The patient is not nervous/anxious.         Objective:   Physical Exam  Constitutional: She appears well-developed and well-nourished. No distress.  HENT:  Head: Normocephalic and atraumatic.  Mouth/Throat: Oropharynx is clear and moist.  Eyes: Conjunctivae normal and EOM are normal. Pupils are equal, round, and reactive to light.  Neck: Normal range of motion. Neck supple. Carotid bruit is not present. No thyromegaly present.  Cardiovascular: Normal rate, regular rhythm and normal heart sounds.   Pulmonary/Chest: Effort normal and breath sounds normal.  Abdominal: Soft. Bowel sounds are normal. She exhibits no abdominal bruit.       No suprapubic tenderness or fullness    Musculoskeletal:       Lumbar back: She exhibits tenderness and spasm. She exhibits normal range of motion, no bony tenderness, no swelling and no edema.       Tender in R piriformis area  Pain worse with extension of spine Pt has notable loss of lordosis Nl gait   Lymphadenopathy:    She has no cervical adenopathy.  Neurological: She is alert. She has normal strength and normal reflexes. No sensory deficit. She exhibits normal muscle tone. Coordination and gait normal.  Skin: Skin is warm and dry. No rash noted.  Psychiatric: She has a normal mood and affect.          Assessment & Plan:

## 2012-06-03 NOTE — Patient Instructions (Addendum)
Take the meloxicam daily with food for 1-2 weeks Try to stretch out and keep walking  Use heating pad or other warm compress whenever you can - 10 minutes at a time If worse/ any urinary symptoms - let me know  If not starting to improve in 1-2 weeks - let me know  Give urine specimen on the way out

## 2012-10-31 ENCOUNTER — Other Ambulatory Visit: Payer: Self-pay

## 2013-01-18 ENCOUNTER — Ambulatory Visit (INDEPENDENT_AMBULATORY_CARE_PROVIDER_SITE_OTHER): Payer: 59 | Admitting: Family Medicine

## 2013-01-18 ENCOUNTER — Encounter: Payer: Self-pay | Admitting: Family Medicine

## 2013-01-18 ENCOUNTER — Ambulatory Visit (INDEPENDENT_AMBULATORY_CARE_PROVIDER_SITE_OTHER)
Admission: RE | Admit: 2013-01-18 | Discharge: 2013-01-18 | Disposition: A | Discharge status: Home / Self Care | Payer: 59 | Source: Ambulatory Visit | Attending: Family Medicine | Admitting: Family Medicine

## 2013-01-18 VITALS — BP 102/56 | HR 64 | Temp 98.5°F | Wt 132.0 lb

## 2013-01-18 DIAGNOSIS — M545 Low back pain: Secondary | ICD-10-CM

## 2013-01-18 MED ORDER — MELOXICAM 15 MG PO TABS: 15.0000 mg | ORAL_TABLET | Freq: Every day | ORAL | Status: DC

## 2013-01-18 MED ORDER — CYCLOBENZAPRINE HCL 5 MG PO TABS: 5.0000 mg | ORAL_TABLET | Freq: Three times a day (TID) | ORAL | Status: DC | PRN

## 2013-01-18 NOTE — Progress Notes (Signed)
SUBJECTIVE:  [name] is a 32 y.o. female who complains of mid- low back pain on and off for weeks. The pain is positional with bending or lifting, without radiation down the legs. 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 Symptoms have been intermittent since that time. Prior history of back problems: recurrent self limited episodes of low back pain in the past. There is no numbness in the legs.  Patient Active Problem List   Diagnosis Date Noted  . Low back pain 06/03/2012  . Right upper quadrant pain 11/15/2011  . URINARY FREQUENCY 10/25/2010  . CONTACT DERMATITIS&OTHER ECZEMA DUE UNSPEC CAUSE 06/05/2010  . SEBACEOUS CYST 06/05/2010  . ALLERGIC RHINITIS 02/27/2010  . GERD 02/27/2010  . FATIGUE 02/27/2010  . HYPOGLYCEMIA, HX OF 02/27/2010  . DIABETES MELLITUS, GESTATIONAL, HX OF 02/27/2010  . DYSURIA 02/19/2010  . UTI 03/14/2009  . RECTAL BLEEDING 11/29/2008   Past Medical History  Diagnosis Date  . Allergy   . Gestational diabetes   . GERD (gastroesophageal reflux disease)   . Allergic rhinitis    Past Surgical History  Procedure Laterality Date  . Cesarean section  06, 09  . Cholecystectomy  11/22/2011    Procedure: LAPAROSCOPIC CHOLECYSTECTOMY;  Surgeon: Emelia Loron, MD;  Location: [facility] OR;  Service: General;  Laterality: N/A;   History  Substance Use Topics  . Smoking status: Never Smoker   . Smokeless tobacco: Never Used  . Alcohol Use: Yes     Comment: socially   Family History  Problem Relation Age of Onset  . Healthy Mother   . Cancer Maternal Grandmother     breast  . Heart disease Paternal Grandmother    Allergies  Allergen Reactions  . Codeine Nausea And Vomiting  . Hydrocodone Nausea And Vomiting  . Morphine And Related Hives and Itching   Current Outpatient Prescriptions on File Prior to Visit  Medication Sig Dispense Refill  . desogestrel-ethinyl estradiol (VELIVET,CAZIANT,CESIA,CYCLESSA) 0.1/0.125/0.15  -0.025 MG tablet Take 1 tablet by mouth at bedtime.       Marland Kitchen esomeprazole (NEXIUM) 40 MG capsule Take 40 mg by mouth at bedtime.      Marland Kitchen ibuprofen (ADVIL,MOTRIN) 200 MG tablet Take 200 mg by mouth as needed.       No current facility-administered medications on file prior to visit.   The PMH, PSH, Social History, Family History, Medications, and allergies have been reviewed in [facility], and have been updated if relevant.  OBJECTIVE: BP 102/56  Pulse 64  Temp(Src) 98.5 F (36.9 C)  Wt 132 lb (59.875 kg)  BMI 23.39 kg/m2  Vital signs as noted above. Patient appears to be in mild to moderate pain, antalgic gait noted. Lumbosacral spine area reveals no local tenderness or mass. Painful and reduced LS ROM noted. Straight leg raise is negative at bilaterally, DTR's, motor strength and sensation normal, including heel and toe gait.  Peripheral pulses are palpable. Lumbar spine X-Ray: ordered, but results not yet available.   ASSESSMENT:  lumbar strain  PLAN: For acute pain, rest, intermittent application of cold packs (later, may switch to heat, but do not sleep on heating pad), analgesics and muscle relaxants are recommended. Discussed longer term treatment plan of prn NSAID's and discussed a home back care exercise program with flexion exercise routine. Proper lifting with avoidance of heavy lifting discussed. Consider Physical Therapy and XRay studies if not improving. Call or return to clinic prn if these symptoms worsen or fail to  improve as anticipated.

## 2013-01-18 NOTE — Patient Instructions (Addendum)
Take Flexeril at night and meloxicam in the morning with food for next 3-5 days. We will call you with your xray results tomorrow.  Doctor's order- you need a massage for mother's day.

## 2013-06-10 ENCOUNTER — Other Ambulatory Visit: Payer: Self-pay | Admitting: Family Medicine

## 2013-06-10 MED ORDER — TRIAMCINOLONE ACETONIDE 0.147 MG/GM EX AERS: INHALATION_SPRAY | Freq: Two times a day (BID) | CUTANEOUS | Status: DC

## 2013-07-22 ENCOUNTER — Other Ambulatory Visit: Payer: Self-pay

## 2013-08-03 ENCOUNTER — Encounter: Payer: Self-pay | Admitting: Internal Medicine

## 2013-08-03 ENCOUNTER — Ambulatory Visit (INDEPENDENT_AMBULATORY_CARE_PROVIDER_SITE_OTHER): Payer: 59 | Admitting: Internal Medicine

## 2013-08-03 VITALS — BP 116/82 | HR 68 | Temp 98.2°F | Wt 131.8 lb

## 2013-08-03 DIAGNOSIS — L309 Dermatitis, unspecified: Secondary | ICD-10-CM

## 2013-08-03 DIAGNOSIS — L259 Unspecified contact dermatitis, unspecified cause: Secondary | ICD-10-CM

## 2013-08-03 MED ORDER — TRIAMCINOLONE ACETONIDE 0.1 % EX CREA: 1.0000 "application " | TOPICAL_CREAM | Freq: Two times a day (BID) | CUTANEOUS | Status: DC

## 2013-08-03 NOTE — Patient Instructions (Signed)

## 2013-08-03 NOTE — Progress Notes (Signed)
Subjective:    Patient ID: [name], female    DOB: 22-Mar-1981, 32 y.o.   MRN: [id number]  HPI  Pt presents to the clinic today with c/o a rash on both of her hips. She noticed this about 3 weeks ago. She thinks it is eczema. She has been putting eucerin lotion on it and hydrocortisone cream with some relief. The rash persist and seems not to go away.  Review of Systems  Past Medical History  Diagnosis Date  . Allergy   . Gestational diabetes   . GERD (gastroesophageal reflux disease)   . Allergic rhinitis     Current Outpatient Prescriptions  Medication Sig Dispense Refill  . cyclobenzaprine (FLEXERIL) 5 MG tablet Take 1 tablet (5 mg total) by mouth 3 (three) times daily as needed for muscle spasms.  30 tablet  1  . desogestrel-ethinyl estradiol (VELIVET,CAZIANT,CESIA,CYCLESSA) 0.1/0.125/0.15 -0.025 MG tablet Take 1 tablet by mouth at bedtime.       Marland Kitchen esomeprazole (NEXIUM) 40 MG capsule Take 40 mg by mouth at bedtime.      Marland Kitchen ibuprofen (ADVIL,MOTRIN) 200 MG tablet Take 200 mg by mouth as needed.      . meloxicam (MOBIC) 15 MG tablet Take 1 tablet (15 mg total) by mouth daily. Take with food  30 tablet  0  . triamcinolone (KENALOG) topical spray Apply topically 2 (two) times daily.  63 g  0   No current facility-administered medications for this visit.    Allergies  Allergen Reactions  . Codeine Nausea And Vomiting  . Hydrocodone Nausea And Vomiting  . Morphine And Related Hives and Itching    Family History  Problem Relation Age of Onset  . Healthy Mother   . Cancer Maternal Grandmother     breast  . Heart disease Paternal Grandmother     History   Social History  . Marital Status: Married    Spouse Name: N/A    Number of Children: 2  . Years of Education: N/A   Occupational History  . [employer]    Social History Main Topics  . Smoking status: Never Smoker   . Smokeless tobacco: Never Used  . Alcohol Use: Yes     Comment: socially  . Drug  Use: 1.00 per week  . Sexual Activity: Yes    Birth Control/ Protection: Pill   Other Topics Concern  . Not on file   Social History Narrative  . No narrative on file     Constitutional: Denies fever, malaise, fatigue, headache or abrupt weight changes.  Skin: Denies redness, lesions or ulcercations.    No other specific complaints in a complete review of systems (except as listed in HPI above).     Objective:   Physical Exam   BP 116/82  Pulse 68  Temp(Src) 98.2 F (36.8 C) (Oral)  Wt 131 lb 12 oz (59.761 kg)  SpO2 99%  LMP 07/16/2013 Wt Readings from Last 3 Encounters:  08/03/13 131 lb 12 oz (59.761 kg)  01/18/13 132 lb (59.875 kg)  06/03/12 126 lb 4 oz (57.267 kg)    General: Appears her stated age, well developed, well nourished in NAD. Skin: Warm, dry and intact. Eczema noted on bilateral hips. No lesions or ulcerations noted. Cardiovascular: Normal rate and rhythm. S1,S2 noted.  No murmur, rubs or gallops noted. No JVD or BLE edema. No carotid bruits noted. Pulmonary/Chest: Normal effort and positive vesicular breath sounds. No respiratory distress. No wheezes, rales or ronchi noted.  BMET    Component Value Date/Time   NA 143 02/27/2010 1114   K 4.1 02/27/2010 1114   CL 107 02/27/2010 1114   CO2 30 02/27/2010 1114   GLUCOSE 76 02/27/2010 1114   BUN 13 02/27/2010 1114   CREATININE 0.6 02/27/2010 1114   CALCIUM 9.3 02/27/2010 1114   GFRNONAA 118.54 02/27/2010 1114   GFRAA  Value: >60        The eGFR has been calculated using the MDRD equation. This calculation has not been validated in all clinical 05/17/2008 0944    Lipid Panel     Component Value Date/Time   CHOL 119 02/27/2010 1114   TRIG 68.0 02/27/2010 1114   HDL 51.30 02/27/2010 1114   CHOLHDL 2 02/27/2010 1114   VLDL 13.6 02/27/2010 1114   LDLCALC 54 02/27/2010 1114    CBC    Component Value Date/Time   WBC 7.1 11/15/2011 1451   RBC 4.57 11/15/2011 1451   HGB 14.0 11/15/2011 1451   HCT 40.3 11/15/2011  1451   PLT 219 11/15/2011 1451   MCV 88.2 11/15/2011 1451   MCH 30.6 11/15/2011 1451   MCHC 34.7 11/15/2011 1451   RDW 12.4 11/15/2011 1451   LYMPHSABS 2.8 11/15/2011 1451   MONOABS 0.3 11/15/2011 1451   EOSABS 0.1 11/15/2011 1451   BASOSABS 0.0 11/15/2011 1451    Hgb A1C Lab Results  Component Value Date   HGBA1C 5.5 02/27/2010        Assessment & Plan:   Eczema:  eRx for triamcinolone cream- use as directed Continue eucerin BID  RTC As needed or if symptoms persist or worsen

## 2013-09-27 ENCOUNTER — Other Ambulatory Visit (HOSPITAL_COMMUNITY): Payer: Self-pay | Admitting: Cardiology

## 2013-09-27 MED ORDER — FLUCONAZOLE 150 MG PO TABS: 150.0000 mg | ORAL_TABLET | Freq: Every day | ORAL | Status: DC

## 2013-12-31 ENCOUNTER — Encounter: Payer: Self-pay | Admitting: Family Medicine

## 2013-12-31 ENCOUNTER — Ambulatory Visit (INDEPENDENT_AMBULATORY_CARE_PROVIDER_SITE_OTHER): Payer: 59 | Admitting: Family Medicine

## 2013-12-31 VITALS — BP 100/58 | HR 114 | Temp 98.0°F | Ht 62.25 in | Wt 125.0 lb

## 2013-12-31 DIAGNOSIS — F418 Other specified anxiety disorders: Secondary | ICD-10-CM | POA: Insufficient documentation

## 2013-12-31 DIAGNOSIS — F341 Dysthymic disorder: Secondary | ICD-10-CM

## 2013-12-31 MED ORDER — SERTRALINE HCL 25 MG PO TABS: 25.0000 mg | ORAL_TABLET | Freq: Every day | ORAL | Status: DC

## 2013-12-31 NOTE — Patient Instructions (Addendum)
Great to see you. Hang in there.  Take zoloft 25 mg daily.  Please stop by to see Rosaria Ferries on your way out to set up your referral.

## 2013-12-31 NOTE — Progress Notes (Signed)
Subjective:   Patient ID: [name], female    DOB: [birth year], 33 y.o.   MRN: [id number]  [name] is a pleasant 33 y.o. year old female who presents to clinic today with Stress  on Jan 25, 2014  HPI: Since her father died, she usually gets "down" around the holidays but perks up around the first of the year.  She never perked up this year and in fact, feels she is getting worse.  Work is stressful.  Husband not supportive.  She is tearful and having mood swings- feels she is taking it out on her daughters. Sleeping ok. Appetite "too good."  Joined weight watchers because she was stress eating and gaining weight. Wt Readings from Last 3 Encounters:  Jan 25, 2014 125 lb (56.7 kg)  08/03/13 131 lb 12 oz (59.761 kg)  01/18/13 132 lb (59.875 kg)   Patient Active Problem List   Diagnosis Date Noted  . Depression with anxiety 01/25/2014  . ALLERGIC RHINITIS 02/27/2010  . GERD 02/27/2010  . DIABETES MELLITUS, GESTATIONAL, HX OF 02/27/2010   Past Medical History  Diagnosis Date  . Allergy   . Gestational diabetes   . GERD (gastroesophageal reflux disease)   . Allergic rhinitis    Past Surgical History  Procedure Laterality Date  . Cesarean section  06, 09  . Cholecystectomy  11/22/2011    Procedure: LAPAROSCOPIC CHOLECYSTECTOMY;  Surgeon: Rolm Bookbinder, MD;  Location: [facility] OR;  Service: General;  Laterality: N/A;   History  Substance Use Topics  . Smoking status: Never Smoker   . Smokeless tobacco: Never Used  . Alcohol Use: Yes     Comment: socially   Family History  Problem Relation Age of Onset  . Healthy Mother   . Cancer Maternal Grandmother     breast  . Heart disease Paternal Grandmother    Allergies  Allergen Reactions  . Codeine Nausea And Vomiting  . Hydrocodone Nausea And Vomiting  . Morphine And Related Hives and Itching   Current Outpatient Prescriptions on File Prior to Visit  Medication Sig Dispense Refill  . cyclobenzaprine (FLEXERIL) 5 MG  tablet Take 1 tablet (5 mg total) by mouth 3 (three) times daily as needed for muscle spasms.  30 tablet  1  . desogestrel-ethinyl estradiol (VELIVET,CAZIANT,CESIA,CYCLESSA) 0.1/0.125/0.15 -0.025 MG tablet Take 1 tablet by mouth at bedtime.       Marland Kitchen ibuprofen (ADVIL,MOTRIN) 200 MG tablet Take 200 mg by mouth as needed.      . meloxicam (MOBIC) 15 MG tablet Take 1 tablet (15 mg total) by mouth daily. Take with food  30 tablet  0  . triamcinolone (KENALOG) topical spray Apply topically 2 (two) times daily.  63 g  0  . triamcinolone cream (KENALOG) 0.1 % Apply 1 application topically 2 (two) times daily.  30 g  0   No current facility-administered medications on file prior to visit.   The PMH, PSH, Social History, Family History, Medications, and allergies have been reviewed in [facility], and have been updated if relevant.    Review of Systems    See HPI No SI or HI No panic attacks Objective:    BP 100/58  Pulse 114  Temp(Src) 98 F (36.7 C) (Oral)  Ht 5' 2.25" (1.581 m)  Wt 125 lb (56.7 kg)  BMI 22.68 kg/m2  SpO2 98%  LMP 12/16/2013   Physical Exam  Gen:  Alert, pleasant, tearful Psych:  Tearful, good eye contact      Assessment &  Plan:   Depression with anxiety No Follow-up on file.

## 2013-12-31 NOTE — Progress Notes (Signed)
Pre visit review using our clinic review tool, if applicable. No additional management support is needed unless otherwise documented below in the visit note. 

## 2013-12-31 NOTE — Assessment & Plan Note (Signed)
>  25 minutes spent in face to face time with patient, >50% spent in counselling or coordination of care Refer for psychotherapy. Also discussed pharmacotherapy- she would like to try SSRI as well.  Has never taken any antidepressants or anxiolytics. Start Zoloft 25 mg daily. Follow up in 1 month.

## 2014-01-20 ENCOUNTER — Ambulatory Visit (INDEPENDENT_AMBULATORY_CARE_PROVIDER_SITE_OTHER): Payer: 59 | Admitting: Psychology

## 2014-01-20 DIAGNOSIS — F4323 Adjustment disorder with mixed anxiety and depressed mood: Secondary | ICD-10-CM

## 2014-02-02 ENCOUNTER — Encounter: Payer: Self-pay | Admitting: Family Medicine

## 2014-02-02 ENCOUNTER — Ambulatory Visit (INDEPENDENT_AMBULATORY_CARE_PROVIDER_SITE_OTHER): Payer: 59 | Admitting: Family Medicine

## 2014-02-02 VITALS — BP 110/66 | HR 69 | Temp 97.8°F | Wt 126.0 lb

## 2014-02-02 DIAGNOSIS — F418 Other specified anxiety disorders: Secondary | ICD-10-CM

## 2014-02-02 DIAGNOSIS — F341 Dysthymic disorder: Secondary | ICD-10-CM

## 2014-02-02 MED ORDER — SERTRALINE HCL 25 MG PO TABS: 50.0000 mg | ORAL_TABLET | Freq: Every day | ORAL | Status: DC

## 2014-02-02 NOTE — Progress Notes (Signed)
Subjective:   Patient ID: [name], female    DOB: 17-Aug-1981, 33 y.o.   MRN: [id number]  [name] is a pleasant 33 y.o. year old female who presents to clinic today with Follow-up  on 02/02/2014  HPI:  Worsening depression and anxiety.  Started Zoloft 25 mg daily and referred to psychotherapy last month.  Does feel better.  Doesn't yell at her children as much.  Feels more "normal." Some days she still feels down but not as many. No sexual side effects.  Appetite "too good."  Joined weight watchers because she was stress eating and gaining weight. Wt Readings from Last 3 Encounters:  02/02/14 126 lb (57.153 kg)  12/31/13 125 lb (56.7 kg)  08/03/13 131 lb 12 oz (59.761 kg)   Saw Terri Bauert once already- she enjoyed the visit and felt it was helpful.  Seeing her again next week.  Patient Active Problem List   Diagnosis Date Noted  . Depression with anxiety 12/31/2013  . ALLERGIC RHINITIS 02/27/2010  . GERD 02/27/2010  . DIABETES MELLITUS, GESTATIONAL, HX OF 02/27/2010   Past Medical History  Diagnosis Date  . Allergy   . Gestational diabetes   . GERD (gastroesophageal reflux disease)   . Allergic rhinitis    Past Surgical History  Procedure Laterality Date  . Cesarean section  06, 09  . Cholecystectomy  11/22/2011    Procedure: LAPAROSCOPIC CHOLECYSTECTOMY;  Surgeon: Rolm Bookbinder, MD;  Location: [facility] OR;  Service: General;  Laterality: N/A;   History  Substance Use Topics  . Smoking status: Never Smoker   . Smokeless tobacco: Never Used  . Alcohol Use: Yes     Comment: socially   Family History  Problem Relation Age of Onset  . Healthy Mother   . Cancer Maternal Grandmother     breast  . Heart disease Paternal Grandmother    Allergies  Allergen Reactions  . Codeine Nausea And Vomiting  . Hydrocodone Nausea And Vomiting  . Morphine And Related Hives and Itching   Current Outpatient Prescriptions on File Prior to Visit  Medication Sig  Dispense Refill  . cyclobenzaprine (FLEXERIL) 5 MG tablet Take 1 tablet (5 mg total) by mouth 3 (three) times daily as needed for muscle spasms.  30 tablet  1  . desogestrel-ethinyl estradiol (VELIVET,CAZIANT,CESIA,CYCLESSA) 0.1/0.125/0.15 -0.025 MG tablet Take 1 tablet by mouth at bedtime.       Marland Kitchen ibuprofen (ADVIL,MOTRIN) 200 MG tablet Take 200 mg by mouth as needed.      . meloxicam (MOBIC) 15 MG tablet Take 1 tablet (15 mg total) by mouth daily. Take with food  30 tablet  0  . sertraline (ZOLOFT) 25 MG tablet Take 1 tablet (25 mg total) by mouth daily.  30 tablet  3  . triamcinolone (KENALOG) topical spray Apply topically 2 (two) times daily.  63 g  0  . triamcinolone cream (KENALOG) 0.1 % Apply 1 application topically 2 (two) times daily.  30 g  0   No current facility-administered medications on file prior to visit.   The PMH, PSH, Social History, Family History, Medications, and allergies have been reviewed in [facility], and have been updated if relevant.    Review of Systems    See HPI No SI or HI No panic attacks Objective:    BP 110/66  Pulse 69  Temp(Src) 97.8 F (36.6 C) (Oral)  Wt 126 lb (57.153 kg)  SpO2 98%  LMP 01/13/2014   Physical Exam  Gen:  Alert, pleasant, tearful Psych:  Tearful, good eye contact    Assessment & Plan:   Depression with anxiety No Follow-up on file.

## 2014-02-02 NOTE — Progress Notes (Signed)
Pre visit review using our clinic review tool, if applicable. No additional management support is needed unless otherwise documented below in the visit note. 

## 2014-02-02 NOTE — Assessment & Plan Note (Signed)
Improved.  She would like to try higher dose of zoloft and if she doesn't like how it makes her feel or does not improve her symptoms more, she wants to decrease back down to 25 mg daily. Continue psychotherapy.

## 2014-02-02 NOTE — Patient Instructions (Signed)
Good to see you.  Send me flag in a few weeks to let me know if you feel better on 50 mg of Zoloft.

## 2014-02-10 ENCOUNTER — Ambulatory Visit (INDEPENDENT_AMBULATORY_CARE_PROVIDER_SITE_OTHER): Payer: 59 | Admitting: Psychology

## 2014-02-10 DIAGNOSIS — F4323 Adjustment disorder with mixed anxiety and depressed mood: Secondary | ICD-10-CM

## 2014-03-01 ENCOUNTER — Other Ambulatory Visit (HOSPITAL_COMMUNITY): Payer: Self-pay

## 2014-03-01 MED ORDER — GABAPENTIN 300 MG PO CAPS: 300.0000 mg | ORAL_CAPSULE | Freq: Three times a day (TID) | ORAL | Status: DC

## 2014-03-03 ENCOUNTER — Encounter: Payer: Self-pay | Admitting: Family Medicine

## 2014-03-03 ENCOUNTER — Ambulatory Visit (INDEPENDENT_AMBULATORY_CARE_PROVIDER_SITE_OTHER): Payer: 59 | Admitting: Family Medicine

## 2014-03-03 VITALS — BP 112/72 | HR 103 | Temp 98.0°F | Wt 126.0 lb

## 2014-03-03 DIAGNOSIS — B029 Zoster without complications: Secondary | ICD-10-CM

## 2014-03-03 MED ORDER — IBUPROFEN 200 MG PO TABS: 600.0000 mg | ORAL_TABLET | Freq: Three times a day (TID) | ORAL | Status: DC | PRN

## 2014-03-03 MED ORDER — PREGABALIN 75 MG PO CAPS: 75.0000 mg | ORAL_CAPSULE | Freq: Two times a day (BID) | ORAL | Status: DC

## 2014-03-03 NOTE — Progress Notes (Signed)
Pre visit review using our clinic review tool, if applicable. No additional management support is needed unless otherwise documented below in the visit note.  Rash on L arm an shoulder initially. Blistering, burning. Started on valtrex within 24 hours.  Started gabapentin more recently.  Meds don't help much.  No R sided sx.  Now with L ear pain.  Temp ~100 last night and today, some chills.  No rhinorrhea, no vomiting, no diarrhea.  Slightly dizzy and drowsy with the gabapentin.  No leg sx.    Intolerant to mult pain meds.    L ear pain noted.   Meds, vitals, and allergies reviewed.   ROS: See HPI.  Otherwise, noncontributory.  nad ncat rrr ctab Dermatomal rash on the L upper back and chest wall, down the L arm, with change in sensation limited to that dermatome.  B TM exam wnl

## 2014-03-03 NOTE — Patient Instructions (Addendum)
Lyrica 75mg  twice a day.  Stop the gabapentin.   Take ibuprofen with food. Finish the valtrex.  Take care.   If not better, then notify us.

## 2014-03-04 DIAGNOSIS — B029 Zoster without complications: Secondary | ICD-10-CM | POA: Insufficient documentation

## 2014-03-04 NOTE — Assessment & Plan Note (Signed)
D/w pt.  Given the distribution on the chest wall and L arm, could have potentially 2 nerve roots involved. Gabapentin not useful, change to lyrica, continue valtrex.  D/w pt.  Out of work for now.  She agrees.  Call back prn. Her ear exam is normal, wouldn't not intervene there.  No rash on the ear, canal.

## 2014-03-10 ENCOUNTER — Ambulatory Visit (INDEPENDENT_AMBULATORY_CARE_PROVIDER_SITE_OTHER): Payer: 59 | Admitting: Psychology

## 2014-03-10 DIAGNOSIS — F4323 Adjustment disorder with mixed anxiety and depressed mood: Secondary | ICD-10-CM

## 2014-04-14 ENCOUNTER — Ambulatory Visit (INDEPENDENT_AMBULATORY_CARE_PROVIDER_SITE_OTHER): Payer: 59 | Admitting: Psychology

## 2014-04-14 DIAGNOSIS — F4323 Adjustment disorder with mixed anxiety and depressed mood: Secondary | ICD-10-CM

## 2014-05-12 ENCOUNTER — Ambulatory Visit (INDEPENDENT_AMBULATORY_CARE_PROVIDER_SITE_OTHER): Payer: 59 | Admitting: Psychology

## 2014-05-12 DIAGNOSIS — F4323 Adjustment disorder with mixed anxiety and depressed mood: Secondary | ICD-10-CM

## 2014-06-07 ENCOUNTER — Encounter: Payer: Self-pay | Admitting: Family Medicine

## 2014-06-07 ENCOUNTER — Ambulatory Visit (INDEPENDENT_AMBULATORY_CARE_PROVIDER_SITE_OTHER): Payer: 59 | Admitting: Family Medicine

## 2014-06-07 VITALS — BP 110/62 | HR 78 | Temp 98.2°F | Wt 131.8 lb

## 2014-06-07 DIAGNOSIS — R5383 Other fatigue: Secondary | ICD-10-CM

## 2014-06-07 DIAGNOSIS — F418 Other specified anxiety disorders: Secondary | ICD-10-CM

## 2014-06-07 DIAGNOSIS — R5381 Other malaise: Secondary | ICD-10-CM

## 2014-06-07 DIAGNOSIS — F341 Dysthymic disorder: Secondary | ICD-10-CM

## 2014-06-07 DIAGNOSIS — R110 Nausea: Secondary | ICD-10-CM | POA: Insufficient documentation

## 2014-06-07 LAB — COMPREHENSIVE METABOLIC PANEL
ALT: 14 U/L (ref 0–35)
AST: 13 U/L (ref 0–37)
Albumin: 4.1 g/dL (ref 3.5–5.2)
Alkaline Phosphatase: 52 U/L (ref 39–117)
BUN: 11 mg/dL (ref 6–23)
CALCIUM: 9.1 mg/dL (ref 8.4–10.5)
CHLORIDE: 104 meq/L (ref 96–112)
CO2: 25 meq/L (ref 19–32)
Creatinine, Ser: 0.8 mg/dL (ref 0.4–1.2)
GFR: 92.86 mL/min (ref >60.00)
Glucose, Bld: 84 mg/dL (ref 70–99)
POTASSIUM: 3.7 meq/L (ref 3.5–5.1)
SODIUM: 136 meq/L (ref 135–145)
TOTAL PROTEIN: 7.8 g/dL (ref 6.0–8.3)
Total Bilirubin: 0.4 mg/dL (ref 0.2–1.2)

## 2014-06-07 MED ORDER — ESOMEPRAZOLE MAGNESIUM 40 MG PO CPDR: DELAYED_RELEASE_CAPSULE | ORAL | Status: DC

## 2014-06-07 NOTE — Assessment & Plan Note (Signed)
New- see below. 

## 2014-06-07 NOTE — Progress Notes (Signed)
Pre visit review using our clinic review tool, if applicable. No additional management support is needed unless otherwise documented below in the visit note. 

## 2014-06-07 NOTE — Patient Instructions (Addendum)
Great to see you. Please restart Nexium- for no more than 2 weeks with an update.  Ok to take Gas X or Beano for belching/bloating.

## 2014-06-07 NOTE — Assessment & Plan Note (Signed)
Stable on low dose zoloft.

## 2014-06-07 NOTE — Assessment & Plan Note (Signed)
New- ?PUD vs gastroenteritis. Will rule out H pylori. Start PPI- nexium daily x 2 weeks. If no improvement, refer to GI for endoscopy. The patient indicates understanding of these issues and agrees with the plan.

## 2014-06-07 NOTE — Progress Notes (Signed)
Subjective:   Patient ID: [name], female    DOB: Dec 16, 1980, 33 y.o.   MRN: [id number]  Vertis Bauder Durango is a pleasant 33 y.o. year old female who presents to clinic today with Abdominal Pain, Nausea and Diarrhea  on 06/07/2014  HPI: 4 days of belching, nausea, no vomiting and diarrhea. Seems worse at night. Not waking her from sleep.  Remote h/o cholecystectomy (2013).  Took Nexium prior to cholecystectomy but has not taken it since.  No fevers. No black stools.  No sick contacts. Not taking anything for these symptoms.  Current Outpatient Prescriptions on File Prior to Visit  Medication Sig Dispense Refill  . cetirizine (ZYRTEC) 10 MG tablet Take 10 mg by mouth daily.      . cyclobenzaprine (FLEXERIL) 5 MG tablet Take 1 tablet (5 mg total) by mouth 3 (three) times daily as needed for muscle spasms.  30 tablet  1  . desogestrel-ethinyl estradiol (VELIVET,CAZIANT,CESIA,CYCLESSA) 0.1/0.125/0.15 -0.025 MG tablet Take 1 tablet by mouth at bedtime.       Marland Kitchen ibuprofen (ADVIL,MOTRIN) 200 MG tablet Take 3 tablets (600 mg total) by mouth every 8 (eight) hours as needed (with food).      . sertraline (ZOLOFT) 25 MG tablet Take 2 tablets (50 mg total) by mouth daily.  60 tablet  3  . triamcinolone (KENALOG) topical spray Apply topically 2 (two) times daily.  63 g  0  . triamcinolone cream (KENALOG) 0.1 % Apply 1 application topically 2 (two) times daily.  30 g  0   No current facility-administered medications on file prior to visit.    Allergies  Allergen Reactions  . Codeine Nausea And Vomiting  . Gabapentin     Sedation at low doses  . Hydrocodone Nausea And Vomiting  . Morphine And Related Hives and Itching    Past Medical History  Diagnosis Date  . Allergy   . Gestational diabetes   . GERD (gastroesophageal reflux disease)   . Allergic rhinitis     Past Surgical History  Procedure Laterality Date  . Cesarean section  06, 09  . Cholecystectomy  11/22/2011   Procedure: LAPAROSCOPIC CHOLECYSTECTOMY;  Surgeon: Rolm Bookbinder, MD;  Location: [facility] OR;  Service: General;  Laterality: N/A;    Family History  Problem Relation Age of Onset  . Healthy Mother   . Cancer Maternal Grandmother     breast  . Heart disease Paternal Grandmother     History   Social History  . Marital Status: Married    Spouse Name: N/A    Number of Children: 2  . Years of Education: N/A   Occupational History  . [employer]    Social History Main Topics  . Smoking status: Never Smoker   . Smokeless tobacco: Never Used  . Alcohol Use: Yes     Comment: socially  . Drug Use: 1.00 per week  . Sexual Activity: Yes    Birth Control/ Protection: Pill   Other Topics Concern  . Not on file   Social History Narrative  . No narrative on file   The PMH, PSH, Social History, Family History, Medications, and allergies have been reviewed in [facility], and have been updated if relevant.   Review of Systems    See HPI No blood in stool +fatigue No vomiting No diaphoresis No flatulence Denies feeling depressed or anxious  Objective:    BP 110/62  Pulse 78  Temp(Src) 98.2 F (36.8 C) (Oral)  Wt 131  lb 12 oz (59.761 kg)  SpO2 98%  LMP 05/26/2014   Physical Exam  Nursing note and vitals reviewed. Constitutional: She is oriented to person, place, and time. She appears well-developed and well-nourished. No distress.  Belches multiple times during exam  Abdominal: Soft. Bowel sounds are normal. She exhibits no distension and no mass. There is no tenderness. There is no rebound and no guarding.  Musculoskeletal: She exhibits no edema.  Neurological: She is alert and oriented to person, place, and time.  Skin: Skin is warm and dry.  Psychiatric: She has a normal mood and affect. Her behavior is normal. Judgment and thought content normal.          Assessment & Plan:   Nausea alone - Plan: H Pylori, IGM, IGG, IGA AB, Comprehensive metabolic  panel  Other malaise and fatigue - Plan: H Pylori, IGM, IGG, IGA AB, Comprehensive metabolic panel No Follow-up on file.

## 2014-06-09 ENCOUNTER — Encounter: Payer: Self-pay | Admitting: Family Medicine

## 2014-06-09 ENCOUNTER — Ambulatory Visit: Payer: 59 | Admitting: Psychology

## 2014-06-09 LAB — H PYLORI, IGM, IGG, IGA AB
H Pylori IgG: 1 U/mL — ABNORMAL HIGH (ref 0.0–0.8)
H. pylori, IgA Abs: <9 units (ref 0.0–8.9)

## 2014-06-10 ENCOUNTER — Ambulatory Visit: Payer: 59 | Admitting: Family Medicine

## 2014-06-13 ENCOUNTER — Other Ambulatory Visit: Payer: Self-pay | Admitting: Family Medicine

## 2014-06-13 ENCOUNTER — Other Ambulatory Visit: Payer: Self-pay | Admitting: Internal Medicine

## 2014-06-13 DIAGNOSIS — L309 Dermatitis, unspecified: Secondary | ICD-10-CM

## 2014-06-13 MED ORDER — FLUCONAZOLE 150 MG PO TABS: 150.0000 mg | ORAL_TABLET | Freq: Once | ORAL | Status: DC

## 2014-06-13 MED ORDER — CLARITHROMYCIN 500 MG PO TABS: 500.0000 mg | ORAL_TABLET | Freq: Two times a day (BID) | ORAL | Status: AC

## 2014-06-13 MED ORDER — TRIAMCINOLONE ACETONIDE 0.1 % EX CREA: 1.0000 "application " | TOPICAL_CREAM | Freq: Two times a day (BID) | CUTANEOUS | Status: DC

## 2014-06-13 MED ORDER — AMOXICILLIN 500 MG PO TABS: 1000.0000 mg | ORAL_TABLET | Freq: Two times a day (BID) | ORAL | Status: DC

## 2014-06-17 ENCOUNTER — Other Ambulatory Visit: Payer: Self-pay | Admitting: Family Medicine

## 2014-06-17 MED ORDER — AMOXICILLIN 500 MG PO TABS: 1000.0000 mg | ORAL_TABLET | Freq: Two times a day (BID) | ORAL | Status: AC

## 2014-07-12 ENCOUNTER — Other Ambulatory Visit: Payer: Self-pay | Admitting: Family Medicine

## 2014-09-22 ENCOUNTER — Ambulatory Visit (INDEPENDENT_AMBULATORY_CARE_PROVIDER_SITE_OTHER)
Admission: RE | Admit: 2014-09-22 | Discharge: 2014-09-22 | Disposition: A | Discharge status: Home / Self Care | Payer: 59 | Source: Ambulatory Visit | Attending: Family Medicine | Admitting: Family Medicine

## 2014-09-22 ENCOUNTER — Other Ambulatory Visit: Payer: Self-pay | Admitting: Family Medicine

## 2014-09-22 ENCOUNTER — Ambulatory Visit (INDEPENDENT_AMBULATORY_CARE_PROVIDER_SITE_OTHER): Payer: 59 | Admitting: Family Medicine

## 2014-09-22 ENCOUNTER — Encounter: Payer: Self-pay | Admitting: Family Medicine

## 2014-09-22 VITALS — BP 112/70 | HR 83 | Temp 97.4°F | Wt 141.5 lb

## 2014-09-22 DIAGNOSIS — R1031 Right lower quadrant pain: Secondary | ICD-10-CM | POA: Insufficient documentation

## 2014-09-22 DIAGNOSIS — R1030 Lower abdominal pain, unspecified: Secondary | ICD-10-CM

## 2014-09-22 DIAGNOSIS — R102 Pelvic and perineal pain: Secondary | ICD-10-CM | POA: Insufficient documentation

## 2014-09-22 DIAGNOSIS — R300 Dysuria: Secondary | ICD-10-CM

## 2014-09-22 DIAGNOSIS — R109 Unspecified abdominal pain: Secondary | ICD-10-CM

## 2014-09-22 LAB — POCT URINALYSIS DIPSTICK
BILIRUBIN UA: NEGATIVE
Blood, UA: NEGATIVE
Glucose, UA: NEGATIVE
KETONES UA: NEGATIVE
LEUKOCYTES UA: NEGATIVE
NITRITE UA: NEGATIVE
PROTEIN UA: NEGATIVE
SPEC GRAV UA: 1.015
Urobilinogen, UA: 1
pH, UA: 6

## 2014-09-22 LAB — COMPREHENSIVE METABOLIC PANEL
ALBUMIN: 4.2 g/dL (ref 3.5–5.2)
ALT: 14 U/L (ref 0–35)
AST: 13 U/L (ref 0–37)
Alkaline Phosphatase: 52 U/L (ref 39–117)
BUN: 10 mg/dL (ref 6–23)
CALCIUM: 9.2 mg/dL (ref 8.4–10.5)
CO2: 25 mEq/L (ref 19–32)
Chloride: 106 mEq/L (ref 96–112)
Creatinine, Ser: 0.7 mg/dL (ref 0.4–1.2)
GFR: 101.92 mL/min (ref >60.00)
Glucose, Bld: 95 mg/dL (ref 70–99)
Potassium: 3.7 mEq/L (ref 3.5–5.1)
Sodium: 138 mEq/L (ref 135–145)
TOTAL PROTEIN: 7.8 g/dL (ref 6.0–8.3)
Total Bilirubin: 0.3 mg/dL (ref 0.2–1.2)

## 2014-09-22 LAB — CBC WITH DIFFERENTIAL/PLATELET
BASOS PCT: 0.3 % (ref 0.0–3.0)
Basophils Absolute: 0 10*3/uL (ref 0.0–0.1)
EOS PCT: 1 % (ref 0.0–5.0)
Eosinophils Absolute: 0.1 10*3/uL (ref 0.0–0.7)
HEMATOCRIT: 43 % (ref 36.0–46.0)
Hemoglobin: 14.1 g/dL (ref 12.0–15.0)
Lymphocytes Relative: 35.7 % (ref 12.0–46.0)
Lymphs Abs: 3 10*3/uL (ref 0.7–4.0)
MCHC: 32.8 g/dL (ref 30.0–36.0)
MCV: 90.6 fl (ref 78.0–100.0)
Monocytes Absolute: 0.4 10*3/uL (ref 0.1–1.0)
Monocytes Relative: 4.8 % (ref 3.0–12.0)
Neutro Abs: 4.9 10*3/uL (ref 1.4–7.7)
Neutrophils Relative %: 58.2 % (ref 43.0–77.0)
Platelets: 253 10*3/uL (ref 150.0–400.0)
RBC: 4.75 Mil/uL (ref 3.87–5.11)
RDW: 12.8 % (ref 11.5–15.5)
WBC: 8.4 10*3/uL (ref 4.0–10.5)

## 2014-09-22 LAB — HCG, QUANTITATIVE, PREGNANCY: Quantitative HCG: 0 m[IU]/mL

## 2014-09-22 MED ORDER — CIPROFLOXACIN HCL 500 MG PO TABS: 500.0000 mg | ORAL_TABLET | Freq: Two times a day (BID) | ORAL | Status: DC

## 2014-09-22 MED ORDER — IOHEXOL 300 MG/ML  SOLN
100.0000 mL | Freq: Once | INTRAMUSCULAR | Status: AC | PRN
  Administered 2014-09-22: 100 mL via INTRAVENOUS

## 2014-09-22 NOTE — Assessment & Plan Note (Signed)
UA and U preg neg. I am more concerned about a process other an UTI give that she is so tender on exam, specifically in RLQ. Discussed with Didi today- further work up is warranted. Will get stat labs and Ct of abdomen/pelvis to rule out appendicitis and other abdominal processes. The patient indicates understanding of these issues and agrees with the plan. Orders Placed This Encounter  Procedures  . CT Abdomen Pelvis W Contrast  . Comprehensive metabolic panel  . CBC with Differential  . hCG, quantitative, pregnancy  . Urinalysis Dipstick

## 2014-09-22 NOTE — Progress Notes (Signed)
Subjective:   Patient ID: [name], female    DOB: 12-19-1980, 34 y.o.   MRN: [id number]  [name] is a pleasant 34 y.o. year old female who presents to clinic today with Dysuria and Flank Pain  on 09/22/2014  HPI: This morning woke up with suprapubic pain that has been constant.  ? Some mild dysuria but not consistently.  No increased urinary frequency but has been drinking more water today in case she did have a UTI.  Feels "bad" today- difficult to pin point symptoms- mainly malaise.  No fevers or chills. No nausea or vomiting.  On OCPs for contraception- has missed a few doses.  LMP 09/01/14. No breast tenderness.  No vaginal bleeding but intercourse was painful last night now that she thinks about it.  Current Outpatient Prescriptions on File Prior to Visit  Medication Sig Dispense Refill  . cetirizine (ZYRTEC) 10 MG tablet Take 10 mg by mouth daily.    . cyclobenzaprine (FLEXERIL) 5 MG tablet Take 1 tablet (5 mg total) by mouth 3 (three) times daily as needed for muscle spasms. 30 tablet 1  . desogestrel-ethinyl estradiol (VELIVET,CAZIANT,CESIA,CYCLESSA) 0.1/0.125/0.15 -0.025 MG tablet Take 1 tablet by mouth at bedtime.     Marland Kitchen ibuprofen (ADVIL,MOTRIN) 200 MG tablet Take 3 tablets (600 mg total) by mouth every 8 (eight) hours as needed (with food).    . sertraline (ZOLOFT) 25 MG tablet TAKE 2 TABLETS BY MOUTH DAILY. 60 tablet 1  . triamcinolone (KENALOG) topical spray Apply topically 2 (two) times daily. 63 g 0  . triamcinolone cream (KENALOG) 0.1 % Apply 1 application topically 2 (two) times daily. 30 g 0   No current facility-administered medications on file prior to visit.    Allergies  Allergen Reactions  . Codeine Nausea And Vomiting  . Gabapentin     Sedation at low doses  . Hydrocodone Nausea And Vomiting  . Morphine And Related Hives and Itching    Past Medical History  Diagnosis Date  . Allergy   . Gestational diabetes   . GERD (gastroesophageal  reflux disease)   . Allergic rhinitis     Past Surgical History  Procedure Laterality Date  . Cesarean section  06, 09  . Cholecystectomy  11/22/2011    Procedure: LAPAROSCOPIC CHOLECYSTECTOMY;  Surgeon: Rolm Bookbinder, MD;  Location: [facility] OR;  Service: General;  Laterality: N/A;    Family History  Problem Relation Age of Onset  . Healthy Mother   . Cancer Maternal Grandmother     breast  . Heart disease Paternal Grandmother     History   Social History  . Marital Status: Married    Spouse Name: N/A    Number of Children: 2  . Years of Education: N/A   Occupational History  . [employer]    Social History Main Topics  . Smoking status: Never Smoker   . Smokeless tobacco: Never Used  . Alcohol Use: Yes     Comment: socially  . Drug Use: 1.00 per week  . Sexual Activity: Yes    Birth Control/ Protection: Pill   Other Topics Concern  . Not on file   Social History Narrative   The PMH, PSH, Social History, Family History, Medications, and allergies have been reviewed in [facility], and have been updated if relevant.   Review of Systems  Constitutional: Negative for fever.  Gastrointestinal: Positive for abdominal pain. Negative for nausea, vomiting, diarrhea, constipation, blood in stool, abdominal distention, anal bleeding  and rectal pain.  Endocrine: Negative.   Genitourinary: Positive for dysuria. Negative for frequency and hematuria.  Musculoskeletal: Negative.   Skin: Negative.   Allergic/Immunologic: Negative.   Neurological: Negative.   Hematological: Negative.   Psychiatric/Behavioral: Negative.   All other systems reviewed and are negative.      Objective:    BP 112/70 mmHg  Pulse 83  Temp(Src) 97.4 F (36.3 C) (Oral)  Wt 141 lb 8 oz (64.184 kg)  SpO2 98%  LMP 09/01/2014   Physical Exam  Constitutional: She is oriented to person, place, and time. She appears well-developed and well-nourished. No distress.  HENT:  Head: Normocephalic.    Eyes: Conjunctivae are normal.  Cardiovascular: Normal rate.   Pulmonary/Chest: Effort normal.  Abdominal: Soft. Normal appearance. She exhibits no mass. There is hepatosplenomegaly. There is tenderness. There is guarding. There is no rebound and no CVA tenderness. No hernia.    Musculoskeletal: Normal range of motion.  Neurological: She is alert and oriented to person, place, and time. No cranial nerve deficit.  Skin: Skin is warm and dry.  Psychiatric: She has a normal mood and affect. Her behavior is normal. Judgment and thought content normal.  Nursing note and vitals reviewed.         Assessment & Plan:   Suprapubic pain, unspecified laterality - Plan: Comprehensive metabolic panel, CBC with Differential, hCG, quantitative, pregnancy, CT Abdomen Pelvis W Contrast  Dysuria - Plan: Urinalysis Dipstick  Flank pain - Plan: Urinalysis Dipstick  RLQ abdominal pain - Plan: Comprehensive metabolic panel, CBC with Differential, hCG, quantitative, pregnancy, CT Abdomen Pelvis W Contrast No Follow-up on file.

## 2014-09-22 NOTE — Patient Instructions (Signed)
Good to see you. Please go to the lab and see Rosaria Ferries or Vaughan Basta on your way out to set up your CT scan. Please keep me updated with your symptoms

## 2014-09-22 NOTE — Progress Notes (Signed)
Pre visit review using our clinic review tool, if applicable. No additional management support is needed unless otherwise documented below in the visit note. 

## 2014-09-25 ENCOUNTER — Emergency Department (HOSPITAL_COMMUNITY)
Admission: EM | Admit: 2014-09-25 | Discharge: 2014-09-25 | Disposition: A | Discharge status: Home / Self Care | Payer: 59 | Source: Home / Self Care

## 2014-09-25 ENCOUNTER — Other Ambulatory Visit (HOSPITAL_COMMUNITY)
Admission: RE | Admit: 2014-09-25 | Discharge: 2014-09-25 | Disposition: A | Discharge status: Home / Self Care | Payer: 59 | Source: Ambulatory Visit | Attending: Emergency Medicine | Admitting: Emergency Medicine

## 2014-09-25 ENCOUNTER — Encounter (HOSPITAL_COMMUNITY): Payer: Self-pay | Admitting: *Deleted

## 2014-09-25 DIAGNOSIS — B373 Candidiasis of vulva and vagina: Secondary | ICD-10-CM

## 2014-09-25 DIAGNOSIS — Z113 Encounter for screening for infections with a predominantly sexual mode of transmission: Secondary | ICD-10-CM | POA: Diagnosis present

## 2014-09-25 DIAGNOSIS — N72 Inflammatory disease of cervix uteri: Secondary | ICD-10-CM

## 2014-09-25 DIAGNOSIS — N760 Acute vaginitis: Secondary | ICD-10-CM | POA: Diagnosis present

## 2014-09-25 DIAGNOSIS — B3731 Acute candidiasis of vulva and vagina: Secondary | ICD-10-CM

## 2014-09-25 DIAGNOSIS — R102 Pelvic and perineal pain: Secondary | ICD-10-CM

## 2014-09-25 LAB — POCT URINALYSIS DIP (DEVICE)
Bilirubin Urine: NEGATIVE
GLUCOSE, UA: 100 mg/dL — AB
Ketones, ur: NEGATIVE mg/dL
Leukocytes, UA: NEGATIVE
Nitrite: POSITIVE — AB
PH: 6 (ref 5.0–8.0)
Protein, ur: 30 mg/dL — AB
Specific Gravity, Urine: 1.025 (ref 1.005–1.030)
UROBILINOGEN UA: 1 mg/dL (ref 0.0–1.0)

## 2014-09-25 LAB — POCT PREGNANCY, URINE: Preg Test, Ur: NEGATIVE

## 2014-09-25 MED ORDER — CEFTRIAXONE SODIUM 250 MG IJ SOLR
250.0000 mg | Freq: Once | INTRAMUSCULAR | Status: AC
  Administered 2014-09-25: 250 mg via INTRAMUSCULAR

## 2014-09-25 MED ORDER — DOXYCYCLINE HYCLATE 100 MG PO CAPS
100.0000 mg | ORAL_CAPSULE | Freq: Two times a day (BID) | ORAL | Status: DC
Start: 2014-09-25 — End: 2015-02-16

## 2014-09-25 MED ORDER — METRONIDAZOLE 500 MG PO TABS: ORAL_TABLET | ORAL | Status: DC

## 2014-09-25 MED ORDER — FLUCONAZOLE 200 MG PO TABS: ORAL_TABLET | ORAL | Status: DC

## 2014-09-25 MED ORDER — LIDOCAINE HCL (PF) 1 % IJ SOLN
INTRAMUSCULAR | Status: AC
  Filled 2014-09-25: qty 5

## 2014-09-25 MED ORDER — CEFTRIAXONE SODIUM 250 MG IJ SOLR
INTRAMUSCULAR | Status: AC
  Filled 2014-09-25: qty 250

## 2014-09-25 NOTE — Discharge Instructions (Signed)
We will contact you if your test results are positive.  Hold on to the prescription for flagyl unless we call you to take.   Cervicitis Cervicitis is a soreness and swelling (inflammation) of the cervix. Your cervix is located at the bottom of your uterus. It opens up to the vagina. CAUSES   Sexually transmitted infections (STIs).   Allergic reaction.   Medicines or birth control devices that are put in the vagina.   Injury to the cervix.   Bacterial infections.  RISK FACTORS You are at greater risk if you:  Have unprotected sexual intercourse.  Have sexual intercourse with many partners.  Began sexual intercourse at an early age.  Have a history of STIs. SYMPTOMS  There may be no symptoms. If symptoms occur, they may include:   Gray, white, yellow, or bad-smelling vaginal discharge.   Pain or itching of the area outside the vagina.   Painful sexual intercourse.   Lower abdominal or lower back pain, especially during intercourse.   Frequent urination.   Abnormal vaginal bleeding between periods, after sexual intercourse, or after menopause.   Pressure or a heavy feeling in the pelvis.  DIAGNOSIS  Diagnosis is made after a pelvic exam. Other tests may include:   Examination of any discharge under a microscope (wet prep).   A Pap test.  TREATMENT  Treatment will depend on the cause of cervicitis. If it is caused by an STI, both you and your partner will need to be treated. Antibiotic medicines will be given.  HOME CARE INSTRUCTIONS   Do not have sexual intercourse until your health care provider says it is okay.   Do not have sexual intercourse until your partner has been treated, if your cervicitis is caused by an STI.   Take your antibiotics as directed. Finish them even if you start to feel better.  SEEK MEDICAL CARE IF:  Your symptoms come back.   You have a fever.  MAKE SURE YOU:   Understand these instructions.  Will watch your  condition.  Will get help right away if you are not doing well or get worse. Document Released: 09/02/2005 Document Revised: 09/07/2013 Document Reviewed: 02/24/2013 [facility] Patient Information 2015 [place], [place]. This information is not intended to replace advice given to you by your health care provider. Make sure you discuss any questions you have with your health care provider.

## 2014-09-25 NOTE — ED Provider Notes (Signed)
CSN: [id number]     Arrival date & time 09/25/14  2694 History   None    Chief Complaint  Patient presents with  . Pelvic Pain   (Consider location/radiation/quality/duration/timing/severity/associated sxs/prior Treatment)  HPI   The patient is a 34 year old female presenting today for complaints of abdominal pain centered over her bladder. Patient states was seen by her primary care provider at [facility] 2 days ago by Dr. Deborra Medina. Patient states she was started on Cipro for probable bladder infection and was even given an abdominal CT, however no pelvic was done. Denies periumbilical pain or RLQ pain.  The patient reports some discomfort with onset of urination at urethral opening, but no discomfort with continued urination over labia. Patient is taking AZO for discomfort.   Past Medical History  Diagnosis Date  . Allergy   . Gestational diabetes   . GERD (gastroesophageal reflux disease)   . Allergic rhinitis    Past Surgical History  Procedure Laterality Date  . Cesarean section  06, 09    x2  . Cholecystectomy  11/22/2011    Procedure: LAPAROSCOPIC CHOLECYSTECTOMY;  Surgeon: Rolm Bookbinder, MD;  Location: [facility] OR;  Service: General;  Laterality: N/A;   Family History  Problem Relation Age of Onset  . Healthy Mother   . Cancer Maternal Grandmother     breast  . Heart disease Paternal Grandmother    History  Substance Use Topics  . Smoking status: Never Smoker   . Smokeless tobacco: Never Used  . Alcohol Use: Yes     Comment: occasional   OB History    No data available     Review of Systems  Constitutional: Negative.  Negative for fever, chills and fatigue.  HENT: Negative.   Eyes: Negative.   Respiratory: Negative.  Negative for shortness of breath.   Cardiovascular: Negative.   Gastrointestinal: Positive for abdominal pain. Negative for nausea, vomiting, diarrhea, constipation, blood in stool, abdominal distention, anal bleeding and rectal pain.    Endocrine: Negative.   Genitourinary: Positive for vaginal pain and pelvic pain. Negative for dysuria, urgency, frequency, flank pain, decreased urine volume, vaginal bleeding, vaginal discharge, enuresis, difficulty urinating, genital sores and dyspareunia.  Musculoskeletal: Negative.   Skin: Negative for rash.  Allergic/Immunologic: Negative.   Neurological: Negative.   Hematological: Negative.   Psychiatric/Behavioral: Negative.        Is on zoloft for depression per patient.     Allergies  Codeine; Gabapentin; Hydrocodone; and Morphine and related  Home Medications   Prior to Admission medications   Medication Sig Start Date End Date Taking? Authorizing Provider  ciprofloxacin (CIPRO) 500 MG tablet Take 1 tablet (500 mg total) by mouth 2 (two) times daily. 09/22/14  Yes Lucille Passy, MD  desogestrel-ethinyl estradiol (VELIVET,CAZIANT,CESIA,CYCLESSA) 0.1/0.125/0.15 -0.025 MG tablet Take 1 tablet by mouth at bedtime.    Yes Historical Provider, MD  ibuprofen (ADVIL,MOTRIN) 200 MG tablet Take 3 tablets (600 mg total) by mouth every 8 (eight) hours as needed (with food). 03/03/14  Yes Tonia Ghent, MD  Probiotic Product (PROBIOTIC PO) Take by mouth.   Yes Historical Provider, MD  sertraline (ZOLOFT) 25 MG tablet TAKE 2 TABLETS BY MOUTH DAILY. 07/12/14  Yes Lucille Passy, MD  cetirizine (ZYRTEC) 10 MG tablet Take 10 mg by mouth daily.    Historical Provider, MD  cyclobenzaprine (FLEXERIL) 5 MG tablet Take 1 tablet (5 mg total) by mouth 3 (three) times daily as needed for muscle spasms. 01/18/13  Lucille Passy, MD  doxycycline (VIBRAMYCIN) 100 MG capsule Take 1 capsule (100 mg total) by mouth 2 (two) times daily. 09/25/14   Nehemiah Settle, NP  fluconazole (DIFLUCAN) 200 MG tablet Take one 200mg  tablet on day 1 and one 200mg  tablet on day 3. 09/25/14   Nehemiah Settle, NP  metroNIDAZOLE (FLAGYL) 500 MG tablet Take four 500mg  tablets of Flagyl by mouth within a 24 hour period. 09/25/14    Nehemiah Settle, NP  triamcinolone (KENALOG) topical spray Apply topically 2 (two) times daily. 06/10/13   Lucille Passy, MD  triamcinolone cream (KENALOG) 0.1 % Apply 1 application topically 2 (two) times daily. 06/13/14   Jearld Fenton, NP   BP 114/72 mmHg  Pulse 88  Temp(Src) 98.5 F (36.9 C) (Oral)  Resp 16  SpO2 95%  LMP 09/01/2014 (Exact Date)   Physical Exam  Constitutional: She appears well-developed and well-nourished. No distress.  Cardiovascular: Normal rate, regular rhythm, normal heart sounds and intact distal pulses.  Exam reveals no gallop and no friction rub.   No murmur heard. Pulmonary/Chest: Effort normal and breath sounds normal. No respiratory distress. She has no wheezes. She has no rales. She exhibits no tenderness.  Abdominal: Soft. Bowel sounds are normal. She exhibits no distension and no mass. There is tenderness. There is no rebound and no guarding.    Negative heel jar test and able to jump down from examination table without discomfort.   Genitourinary:  Patient has mild generalized redness of external genitalia between the labia. No evidence of breaks in skin or sores. Thick white discharge noted in vaginal vault and from cervical os.  In addition, green discharge present with specimen collection. Negative cervical motion tenderness. Moderate discomfort with cervical manipulation during bimanual.  Skin: Skin is warm and dry. No rash noted. She is not diaphoretic. No erythema. No pallor.  Nursing note and vitals reviewed.   ED Course  Procedures (including critical care time) Labs Review Labs Reviewed  POCT URINALYSIS DIP (DEVICE) - Abnormal; Notable for the following:    Glucose, UA 100 (*)    Hgb urine dipstick TRACE (*)    Protein, ur 30 (*)    Nitrite POSITIVE (*)    All other components within normal limits  URINE CULTURE  POCT PREGNANCY, URINE  CERVICOVAGINAL ANCILLARY ONLY   Results for orders placed or performed during the hospital  encounter of 09/25/14  POCT urinalysis dip (device)  Result Value Ref Range   Glucose, UA 100 (A) NEGATIVE mg/dL   Bilirubin Urine NEGATIVE NEGATIVE   Ketones, ur NEGATIVE NEGATIVE mg/dL   Specific Gravity, Urine 1.025 1.005 - 1.030   Hgb urine dipstick TRACE (A) NEGATIVE   pH 6.0 5.0 - 8.0   Protein, ur 30 (A) NEGATIVE mg/dL   Urobilinogen, UA 1.0 0.0 - 1.0 mg/dL   Nitrite POSITIVE (A) NEGATIVE   Leukocytes, UA NEGATIVE NEGATIVE  Pregnancy, urine POC  Result Value Ref Range   Preg Test, Ur NEGATIVE NEGATIVE   Urine culture, GC/Chlamydia and Wet Prep AFFIRM pending.  Patient is on AZO, dipstick not conclusive.  Imaging Review No results found.   MDM   1. Cervicitis   2. Pelvic pain in female   3. Vaginal candida    Meds ordered this encounter  Medications  . Probiotic Product (PROBIOTIC PO)    Sig: Take by mouth.  . cefTRIAXone (ROCEPHIN) injection 250 mg    Sig:   . doxycycline (VIBRAMYCIN) 100 MG  capsule    Sig: Take 1 capsule (100 mg total) by mouth 2 (two) times daily.    Dispense:  14 capsule    Refill:  0  . fluconazole (DIFLUCAN) 200 MG tablet    Sig: Take one 200mg  tablet on day 1 and one 200mg  tablet on day 3.    Dispense:  2 tablet    Refill:  0  . metroNIDAZOLE (FLAGYL) 500 MG tablet    Sig: Take four 500mg  tablets of Flagyl by mouth within a 24 hour period.    Dispense:  4 tablet    Refill:  0   The patient advised to continue her probiotic and discontinue the Cipro.  The patient is to hold the prescription for Flagyl unless advised to take it pending positive BV/Trich test results.  The patient verbalizes understanding and agrees to plan of care.       Nehemiah Settle, NP 09/25/14 1038

## 2014-09-25 NOTE — ED Notes (Signed)
Started 3 days ago with low abd pain into pelvis.  Went to PCP - blood and urine tests normal; had CT scan - normal per pt.  Was started on Cipro 2 days ago.  Felt slight improvement, then worse yesterday and today.  Denies n/v/d; denies fevers.  Denies vaginal discharge.  Has been taking Cipro, IBU, and AZO without any relief.  Does c/o slight dysuria at start of urine flow.

## 2014-09-25 NOTE — ED Notes (Signed)
S/S allergic reaction reviewed w/ pt.

## 2014-09-26 ENCOUNTER — Other Ambulatory Visit: Payer: Self-pay | Admitting: Family Medicine

## 2014-09-26 LAB — URINE CULTURE
COLONY COUNT: NO GROWTH
CULTURE: NO GROWTH
Special Requests: NORMAL

## 2014-09-26 LAB — CERVICOVAGINAL ANCILLARY ONLY
Chlamydia: NEGATIVE
NEISSERIA GONORRHEA: NEGATIVE

## 2014-09-26 MED ORDER — SERTRALINE HCL 25 MG PO TABS: 50.0000 mg | ORAL_TABLET | Freq: Every day | ORAL | Status: DC

## 2014-09-27 LAB — CERVICOVAGINAL ANCILLARY ONLY
WET PREP (BD AFFIRM): NEGATIVE
Wet Prep (BD Affirm): NEGATIVE
Wet Prep (BD Affirm): NEGATIVE

## 2014-10-09 ENCOUNTER — Emergency Department (HOSPITAL_COMMUNITY)
Admission: EM | Admit: 2014-10-09 | Discharge: 2014-10-09 | Disposition: A | Discharge status: Home / Self Care | Payer: 59 | Attending: Emergency Medicine | Admitting: Emergency Medicine

## 2014-10-09 ENCOUNTER — Encounter (HOSPITAL_COMMUNITY): Payer: Self-pay | Admitting: *Deleted

## 2014-10-09 DIAGNOSIS — Z792 Long term (current) use of antibiotics: Secondary | ICD-10-CM | POA: Insufficient documentation

## 2014-10-09 DIAGNOSIS — K219 Gastro-esophageal reflux disease without esophagitis: Secondary | ICD-10-CM | POA: Diagnosis not present

## 2014-10-09 DIAGNOSIS — Z7952 Long term (current) use of systemic steroids: Secondary | ICD-10-CM | POA: Insufficient documentation

## 2014-10-09 DIAGNOSIS — J029 Acute pharyngitis, unspecified: Secondary | ICD-10-CM | POA: Insufficient documentation

## 2014-10-09 DIAGNOSIS — R509 Fever, unspecified: Secondary | ICD-10-CM | POA: Diagnosis present

## 2014-10-09 DIAGNOSIS — Z79899 Other long term (current) drug therapy: Secondary | ICD-10-CM | POA: Insufficient documentation

## 2014-10-09 DIAGNOSIS — Z8632 Personal history of gestational diabetes: Secondary | ICD-10-CM | POA: Insufficient documentation

## 2014-10-09 MED ORDER — DEXAMETHASONE SODIUM PHOSPHATE 10 MG/ML IJ SOLN
10.0000 mg | Freq: Once | INTRAMUSCULAR | Status: AC
  Administered 2014-10-09: 10 mg via INTRAMUSCULAR
  Filled 2014-10-09: qty 1

## 2014-10-09 MED ORDER — PENICILLIN G BENZATHINE 1200000 UNIT/2ML IM SUSP
1.2000 10*6.[IU] | Freq: Once | INTRAMUSCULAR | Status: AC
  Administered 2014-10-09: 1.2 10*6.[IU] via INTRAMUSCULAR
  Filled 2014-10-09: qty 2

## 2014-10-09 MED ORDER — ONDANSETRON 4 MG PO TBDP: 4.0000 mg | ORAL_TABLET | Freq: Three times a day (TID) | ORAL | Status: DC | PRN

## 2014-10-09 MED ORDER — HYDROCODONE-ACETAMINOPHEN 7.5-325 MG/15ML PO SOLN: 15.0000 mL | ORAL | Status: DC | PRN

## 2014-10-09 NOTE — ED Provider Notes (Signed)
CSN: [id number]     Arrival date & time 10/09/14  1705 History  This chart was scribed for non-physician practitioner, Quincy Carnes, PA-C working with Ernestina Patches, MD by Frederich Balding, ED scribe. This patient was seen in room TR08C/TR08C and the patient's care was started at 5:21 PM.    Chief Complaint  Patient presents with  . Fever  . Sore Throat   The history is provided by the patient. No language interpreter was used.    HPI Comments: [name] is a 34 y.o. female who presents to the Emergency Department complaining of worsening sore throat for the past 2 days.  States it hurts on both sides, but worse on the right right side. Swallowing worsens pain. Also reports intermittent fever. She has alternated tylenol and motrin with relief of fever but not sore throat. Denies trouble swallowing, SOB.  Past Medical History  Diagnosis Date  . Allergy   . Gestational diabetes   . GERD (gastroesophageal reflux disease)   . Allergic rhinitis    Past Surgical History  Procedure Laterality Date  . Cesarean section  06, 09    x2  . Cholecystectomy  11/22/2011    Procedure: LAPAROSCOPIC CHOLECYSTECTOMY;  Surgeon: Rolm Bookbinder, MD;  Location: [facility] OR;  Service: General;  Laterality: N/A;   Family History  Problem Relation Age of Onset  . Healthy Mother   . Cancer Maternal Grandmother     breast  . Heart disease Paternal Grandmother    History  Substance Use Topics  . Smoking status: Never Smoker   . Smokeless tobacco: Never Used  . Alcohol Use: Yes     Comment: occasional   OB History    No data available     Review of Systems  Constitutional: Positive for fever.  HENT: Positive for sore throat. Negative for trouble swallowing.   All other systems reviewed and are negative.  Allergies  Codeine; Gabapentin; Hydrocodone; and Morphine and related  Home Medications   Prior to Admission medications   Medication Sig Start Date End Date Taking? Authorizing Provider   cetirizine (ZYRTEC) 10 MG tablet Take 10 mg by mouth daily.    Historical Provider, MD  ciprofloxacin (CIPRO) 500 MG tablet Take 1 tablet (500 mg total) by mouth 2 (two) times daily. 09/22/14   Lucille Passy, MD  cyclobenzaprine (FLEXERIL) 5 MG tablet Take 1 tablet (5 mg total) by mouth 3 (three) times daily as needed for muscle spasms. 01/18/13   Lucille Passy, MD  desogestrel-ethinyl estradiol (VELIVET,CAZIANT,CESIA,CYCLESSA) 0.1/0.125/0.15 -0.025 MG tablet Take 1 tablet by mouth at bedtime.     Historical Provider, MD  doxycycline (VIBRAMYCIN) 100 MG capsule Take 1 capsule (100 mg total) by mouth 2 (two) times daily. 09/25/14   Nehemiah Settle, NP  fluconazole (DIFLUCAN) 200 MG tablet Take one 200mg  tablet on day 1 and one 200mg  tablet on day 3. 09/25/14   Nehemiah Settle, NP  ibuprofen (ADVIL,MOTRIN) 200 MG tablet Take 3 tablets (600 mg total) by mouth every 8 (eight) hours as needed (with food). 03/03/14   Tonia Ghent, MD  metroNIDAZOLE (FLAGYL) 500 MG tablet Take four 500mg  tablets of Flagyl by mouth within a 24 hour period. 09/25/14   Nehemiah Settle, NP  Probiotic Product (PROBIOTIC PO) Take by mouth.    Historical Provider, MD  sertraline (ZOLOFT) 25 MG tablet Take 2 tablets (50 mg total) by mouth daily. 09/26/14   Lucille Passy, MD  triamcinolone (KENALOG) topical  spray Apply topically 2 (two) times daily. 06/10/13   Lucille Passy, MD  triamcinolone cream (KENALOG) 0.1 % Apply 1 application topically 2 (two) times daily. 06/13/14   Jearld Fenton, NP   BP 112/65 mmHg  Pulse 106  Temp(Src) 98 F (36.7 C) (Oral)  Resp 16  Ht 5\' 2"  (1.575 m)  Wt 135 lb (61.236 kg)  BMI 24.69 kg/m2  SpO2 97%  LMP 10/02/2014   Physical Exam  Constitutional: She is oriented to person, place, and time. She appears well-developed and well-nourished.  HENT:  Head: Normocephalic and atraumatic.  Mouth/Throat: Uvula is midline and mucous membranes are normal. No oral lesions. No uvula swelling. Posterior  oropharyngeal erythema present. No posterior oropharyngeal edema or tonsillar abscesses.  Tonsils 2+ bilaterally with exudates on right tonsil only; uvula midline without evidence of peritonsillar abscess; handling secretions appropriately; no difficulty swallowing or speaking  Eyes: Conjunctivae and EOM are normal. Pupils are equal, round, and reactive to light.  Neck: Trachea normal, normal range of motion and phonation normal. No tracheal tenderness present. No tracheal deviation present.  Cardiovascular: Normal rate, regular rhythm and normal heart sounds.   Pulmonary/Chest: Effort normal and breath sounds normal. No stridor. No respiratory distress. She has no wheezes.  Musculoskeletal: Normal range of motion.  Neurological: She is alert and oriented to person, place, and time.  Skin: Skin is warm and dry.  Psychiatric: She has a normal mood and affect.  Nursing note and vitals reviewed.   ED Course  Procedures (including critical care time)  DIAGNOSTIC STUDIES: Oxygen Saturation is 97% on RA, normal by my interpretation.    COORDINATION OF CARE: 5:23 PM-Discussed treatment plan which includes bicillin and hycodan with pt at bedside and pt agreed to plan.   Labs Review Labs Reviewed - No data to display  Imaging Review No results found.   EKG Interpretation None      MDM   Final diagnoses:  Sore throat   65-year-old female with sore throat for the past 2 days. Pain worse on right side. On exam, patient afebrile and nontoxic in appearance. Her tonsils are 2+ bilaterally but with exudates only on right tonsil. Her uvula remains midline and there is no evidence of peritonsillar abscess or retropharyngeal abscess. She is handling secretions well, phonation normal, no stridor. Patient treated empirically with Bicillin and Decadron. She will follow-up with her primary care physician. Hycet and zofran for home.  Discussed plan with patient, he/she acknowledged understanding and  agreed with plan of care.  Return precautions given for new or worsening symptoms.  I personally performed the services described in this documentation, which was scribed in my presence. The recorded information has been reviewed and is accurate.  Larene Pickett, PA-C 10/09/14 1829  Ernestina Patches, MD 10/10/14 2203

## 2014-10-09 NOTE — ED Notes (Signed)
Pt reports fever and right side sore throat since Friday.

## 2014-10-09 NOTE — Discharge Instructions (Signed)
You have been treated for strep pharyngitis.   Take hycet as needed for sore throat. Follow-up with your primary care physician. Return to the ED for new concerns.

## 2014-12-27 ENCOUNTER — Other Ambulatory Visit (HOSPITAL_COMMUNITY): Payer: Self-pay | Admitting: Cardiology

## 2014-12-27 MED ORDER — FLUCONAZOLE 200 MG PO TABS: ORAL_TABLET | ORAL | Status: DC

## 2014-12-27 MED ORDER — SULFAMETHOXAZOLE-TRIMETHOPRIM 800-160 MG PO TABS: 1.0000 | ORAL_TABLET | Freq: Two times a day (BID) | ORAL | Status: DC

## 2015-02-16 ENCOUNTER — Ambulatory Visit (INDEPENDENT_AMBULATORY_CARE_PROVIDER_SITE_OTHER): Payer: 59 | Admitting: Family Medicine

## 2015-02-16 ENCOUNTER — Encounter: Payer: Self-pay | Admitting: Family Medicine

## 2015-02-16 VITALS — BP 110/64 | HR 101 | Temp 98.1°F | Wt 144.5 lb

## 2015-02-16 DIAGNOSIS — M25511 Pain in right shoulder: Secondary | ICD-10-CM | POA: Insufficient documentation

## 2015-02-16 DIAGNOSIS — L255 Unspecified contact dermatitis due to plants, except food: Secondary | ICD-10-CM | POA: Diagnosis not present

## 2015-02-16 MED ORDER — FLUOCINONIDE-E 0.05 % EX CREA: 1.0000 "application " | TOPICAL_CREAM | Freq: Two times a day (BID) | CUTANEOUS | Status: DC

## 2015-02-16 NOTE — Assessment & Plan Note (Signed)
New- lidex twice daily for no more than 10 days consecutively without follow up. The patient indicates understanding of these issues and agrees with the plan.

## 2015-02-16 NOTE — Patient Instructions (Signed)
Good to see you. Ok to take Ibuprofen 800 mg three times daily with food for next few days.  Lidex twice daily for no more than 10 days without letting me know.

## 2015-02-16 NOTE — Progress Notes (Signed)
Subjective:   Patient ID: [name], female    DOB: [birth year], 34 y.o.   MRN: [id number]  [name] is a pleasant 34 y.o. year old female who presents to clinic today with Shoulder Pain and rash on 02/16/2015  HPI:  Right shoulder pain- 5 days ago, was kayaking with her friends and when she tried to get out of he kayak and onto a rock, she slipped and fell forward.  Right shoulder started to hurt that evening.  FROM of right shoulder.  No swelling or popping.  Was really sore two days ago but Ibuprofen has helped.  Rash- started on right forearm, very itchy.  Now has spread a little to her right wrist.  This started a few days ago.  Has been applying calamine lotion without much relief.  Current Outpatient Prescriptions on File Prior to Visit  Medication Sig Dispense Refill  . cetirizine (ZYRTEC) 10 MG tablet Take 10 mg by mouth daily.    . cyclobenzaprine (FLEXERIL) 5 MG tablet Take 1 tablet (5 mg total) by mouth 3 (three) times daily as needed for muscle spasms. 30 tablet 1  . desogestrel-ethinyl estradiol (VELIVET,CAZIANT,CESIA,CYCLESSA) 0.1/0.125/0.15 -0.025 MG tablet Take 1 tablet by mouth at bedtime.     Marland Kitchen ibuprofen (ADVIL,MOTRIN) 200 MG tablet Take 3 tablets (600 mg total) by mouth every 8 (eight) hours as needed (with food).    . Probiotic Product (PROBIOTIC PO) Take by mouth.    . triamcinolone (KENALOG) topical spray Apply topically 2 (two) times daily. 63 g 0  . triamcinolone cream (KENALOG) 0.1 % Apply 1 application topically 2 (two) times daily. 30 g 0   No current facility-administered medications on file prior to visit.    Allergies  Allergen Reactions  . Codeine Nausea And Vomiting  . Gabapentin     Sedation at low doses  . Hydrocodone Nausea And Vomiting  . Morphine And Related Hives and Itching    Past Medical History  Diagnosis Date  . Allergy   . Gestational diabetes   . GERD (gastroesophageal reflux disease)   . Allergic rhinitis     Past  Surgical History  Procedure Laterality Date  . Cesarean section  06, 09    x2  . Cholecystectomy  11/22/2011    Procedure: LAPAROSCOPIC CHOLECYSTECTOMY;  Surgeon: Rolm Bookbinder, MD;  Location: [facility] OR;  Service: General;  Laterality: N/A;    Family History  Problem Relation Age of Onset  . Healthy Mother   . Cancer Maternal Grandmother     breast  . Heart disease Paternal Grandmother     History   Social History  . Marital Status: Married    Spouse Name: N/A  . Number of Children: 2  . Years of Education: N/A   Occupational History  . [employer]    Social History Main Topics  . Smoking status: Never Smoker   . Smokeless tobacco: Never Used  . Alcohol Use: Yes     Comment: occasional  . Drug Use: No  . Sexual Activity: Yes    Birth Control/ Protection: Pill   Other Topics Concern  . Not on file   Social History Narrative   The PMH, PSH, Social History, Family History, Medications, and allergies have been reviewed in [facility], and have been updated if relevant.   Review of Systems  Constitutional: Negative for fever.  HENT: Negative.   Eyes: Negative.   Respiratory: Negative.   Cardiovascular: Negative.   Gastrointestinal: Negative.  Endocrine: Negative.   Genitourinary: Negative.   Musculoskeletal: Positive for joint swelling.  Skin: Positive for rash.  Allergic/Immunologic: Negative.   Hematological: Negative.   Psychiatric/Behavioral: Negative.        Objective:    BP 110/64 mmHg  Pulse 101  Temp(Src) 98.1 F (36.7 C) (Oral)  Wt 144 lb 8 oz (65.545 kg)  SpO2 99%  LMP 02/03/2015   Physical Exam  Constitutional: She is oriented to person, place, and time. She appears well-developed and well-nourished. No distress.  HENT:  Head: Normocephalic.  Eyes: Conjunctivae are normal.  Cardiovascular: Normal rate.   Musculoskeletal:       Right shoulder: She exhibits tenderness. She exhibits normal range of motion, no bony tenderness, no  swelling, no effusion, no crepitus, no deformity, no laceration, no pain, no spasm, normal pulse and normal strength.  Neurological: She is alert and oriented to person, place, and time. No cranial nerve deficit.  Skin: Rash noted. Rash is maculopapular and urticarial.     Psychiatric: She has a normal mood and affect. Her behavior is normal. Judgment and thought content normal.  Nursing note and vitals reviewed.         Assessment & Plan:   Right shoulder pain  Plant dermatitis No Follow-up on file.

## 2015-02-16 NOTE — Progress Notes (Signed)
Pre visit review using our clinic review tool, if applicable. No additional management support is needed unless otherwise documented below in the visit note. 

## 2015-02-16 NOTE — Assessment & Plan Note (Signed)
New- s/p fall. Probable sprain.  No impingement signs on exam and has FROM of shoulder with normal grip strength bilaterally. Advised continued NSAIDs- Ibuprofen up to 800 mg three times daily prn with food for next few days. Call or return to clinic prn if these symptoms worsen or fail to improve as anticipated. The patient indicates understanding of these issues and agrees with the plan.

## 2015-02-20 ENCOUNTER — Ambulatory Visit (INDEPENDENT_AMBULATORY_CARE_PROVIDER_SITE_OTHER): Payer: 59 | Admitting: Family Medicine

## 2015-02-20 ENCOUNTER — Encounter: Payer: Self-pay | Admitting: Family Medicine

## 2015-02-20 VITALS — BP 118/74 | HR 89 | Temp 97.9°F | Wt 146.0 lb

## 2015-02-20 DIAGNOSIS — L255 Unspecified contact dermatitis due to plants, except food: Secondary | ICD-10-CM

## 2015-02-20 DIAGNOSIS — L237 Allergic contact dermatitis due to plants, except food: Secondary | ICD-10-CM | POA: Diagnosis not present

## 2015-02-20 MED ORDER — DEXAMETHASONE SODIUM PHOSPHATE 10 MG/ML IJ SOLN
10.0000 mg | Freq: Once | INTRAMUSCULAR | Status: AC
  Administered 2015-02-20: 10 mg via INTRAMUSCULAR

## 2015-02-20 MED ORDER — PREDNISONE 20 MG PO TABS: ORAL_TABLET | ORAL | Status: DC

## 2015-02-20 NOTE — Progress Notes (Signed)
Subjective:   Patient ID: [name], female    DOB: 11/30/1980, 34 y.o.   MRN: [id number]  [name] is a pleasant 34 y.o. year old female who presents to clinic today with [place] and rash on 02/20/2015  HPI:  Saw her last week for right shoulder injury (4 days ago).  At that times he also had a very itchy rash that had started on her right forearm and had spread a little to her right wrist.  She had been applying calamine lotion without much relief.  eRx sent for lidex which did help but now rash has spread "all over my body."  Still very itchy.  Current Outpatient Prescriptions on File Prior to Visit  Medication Sig Dispense Refill  . cetirizine (ZYRTEC) 10 MG tablet Take 10 mg by mouth daily.    . cyclobenzaprine (FLEXERIL) 5 MG tablet Take 1 tablet (5 mg total) by mouth 3 (three) times daily as needed for muscle spasms. 30 tablet 1  . desogestrel-ethinyl estradiol (VELIVET,CAZIANT,CESIA,CYCLESSA) 0.1/0.125/0.15 -0.025 MG tablet Take 1 tablet by mouth at bedtime.     . fluocinonide-emollient (LIDEX-E) 0.05 % cream Apply 1 application topically 2 (two) times daily. 30 g 0  . ibuprofen (ADVIL,MOTRIN) 200 MG tablet Take 3 tablets (600 mg total) by mouth every 8 (eight) hours as needed (with food).    . Probiotic Product (PROBIOTIC PO) Take by mouth.    . triamcinolone (KENALOG) topical spray Apply topically 2 (two) times daily. 63 g 0  . triamcinolone cream (KENALOG) 0.1 % Apply 1 application topically 2 (two) times daily. 30 g 0   No current facility-administered medications on file prior to visit.    Allergies  Allergen Reactions  . Codeine Nausea And Vomiting  . Gabapentin     Sedation at low doses  . Hydrocodone Nausea And Vomiting  . Morphine And Related Hives and Itching    Past Medical History  Diagnosis Date  . Allergy   . Gestational diabetes   . GERD (gastroesophageal reflux disease)   . Allergic rhinitis     Past Surgical History  Procedure  Laterality Date  . Cesarean section  06, 09    x2  . Cholecystectomy  11/22/2011    Procedure: LAPAROSCOPIC CHOLECYSTECTOMY;  Surgeon: Rolm Bookbinder, MD;  Location: [facility] OR;  Service: General;  Laterality: N/A;    Family History  Problem Relation Age of Onset  . Healthy Mother   . Cancer Maternal Grandmother     breast  . Heart disease Paternal Grandmother     History   Social History  . Marital Status: Married    Spouse Name: N/A  . Number of Children: 2  . Years of Education: N/A   Occupational History  . [place] [employer]    Social History Main Topics  . Smoking status: Never Smoker   . Smokeless tobacco: Never Used  . Alcohol Use: Yes     Comment: occasional  . Drug Use: No  . Sexual Activity: Yes    Birth Control/ Protection: Pill   Other Topics Concern  . Not on file   Social History Narrative   The PMH, PSH, Social History, Family History, Medications, and allergies have been reviewed in [facility], and have been updated if relevant.   Review of Systems  Constitutional: Negative for fever.  HENT: Negative.   Eyes: Negative.   Respiratory: Negative.   Cardiovascular: Negative.   Gastrointestinal: Negative.   Endocrine: Negative.   Genitourinary:  Negative.   Musculoskeletal: Negative for joint swelling.  Skin: Positive for rash.  Allergic/Immunologic: Negative.   Hematological: Negative.   Psychiatric/Behavioral: Negative.        Objective:    BP 118/74 mmHg  Pulse 89  Temp(Src) 97.9 F (36.6 C) (Oral)  Wt 146 lb (66.225 kg)  SpO2 98%  LMP 02/03/2015   Physical Exam  Constitutional: She is oriented to person, place, and time. She appears well-developed and well-nourished. No distress.  HENT:  Head: Normocephalic.  Eyes: Conjunctivae are normal.  Cardiovascular: Normal rate.   Musculoskeletal:       Right shoulder: She exhibits normal range of motion, no tenderness, no bony tenderness, no swelling, no effusion, no crepitus, no deformity, no  laceration, no pain, no spasm, normal pulse and normal strength.  Neurological: She is alert and oriented to person, place, and time. No cranial nerve deficit.  Skin: Rash noted. Rash is maculopapular and urticarial.     Psychiatric: She has a normal mood and affect. Her behavior is normal. Judgment and thought content normal.  Nursing note and vitals reviewed.         Assessment & Plan:   Poison oak dermatitis - Plan: dexamethasone (DECADRON) injection 10 mg  Plant dermatitis No Follow-up on file.

## 2015-02-20 NOTE — Progress Notes (Signed)
Pre visit review using our clinic review tool, if applicable. No additional management support is needed unless otherwise documented below in the visit note. 

## 2015-02-20 NOTE — Patient Instructions (Signed)
Good to see you. Start taking prednisone in 48 hours if you are still having symptoms.  Please keep me updated.

## 2015-02-20 NOTE — Assessment & Plan Note (Signed)
Deteriorated. IM decadron in office, eRx sent for prednisone taper to start in 48 hours if symptoms persist. Call or return to clinic prn if these symptoms worsen or fail to improve as anticipated. The patient indicates understanding of these issues and agrees with the plan.

## 2015-06-07 ENCOUNTER — Ambulatory Visit (INDEPENDENT_AMBULATORY_CARE_PROVIDER_SITE_OTHER): Payer: 59 | Admitting: Family Medicine

## 2015-06-07 ENCOUNTER — Encounter: Payer: Self-pay | Admitting: Family Medicine

## 2015-06-07 VITALS — BP 102/72 | HR 84 | Temp 98.6°F | Wt 146.5 lb

## 2015-06-07 DIAGNOSIS — R21 Rash and other nonspecific skin eruption: Secondary | ICD-10-CM

## 2015-06-07 MED ORDER — FLUOCINONIDE-E 0.05 % EX CREA: 1.0000 "application " | TOPICAL_CREAM | Freq: Two times a day (BID) | CUTANEOUS | Status: DC

## 2015-06-07 NOTE — Progress Notes (Signed)
Pre visit review using our clinic review tool, if applicable. No additional management support is needed unless otherwise documented below in the visit note. 

## 2015-06-07 NOTE — Assessment & Plan Note (Signed)
?   Bed bugs Hand out given for pt to look for bed bugs and how to detect them and eliminate them. Advised continued anthistamine and eRx refilled for lidex twice daily prn itching for no longer than 10 days. The patient indicates understanding of these issues and agrees with the plan.

## 2015-06-07 NOTE — Progress Notes (Signed)
Subjective:   Patient ID: [name], female    DOB: 1980-12-23, 34 y.o.   MRN: [id number]  [name] is a pleasant 34 y.o. year old female who presents to clinic today with Rash  on 06/07/2015  HPI: Very itchy, rash of small blisters "all over her body" since her husband returned from a trip out of town.  No changes in soaps or detergents.  Bendaryl has helped with itching.  No wheezing or difficulty breathing.  Current Outpatient Prescriptions on File Prior to Visit  Medication Sig Dispense Refill  . cetirizine (ZYRTEC) 10 MG tablet Take 10 mg by mouth daily.    . cyclobenzaprine (FLEXERIL) 5 MG tablet Take 1 tablet (5 mg total) by mouth 3 (three) times daily as needed for muscle spasms. 30 tablet 1  . desogestrel-ethinyl estradiol (VELIVET,CAZIANT,CESIA,CYCLESSA) 0.1/0.125/0.15 -0.025 MG tablet Take 1 tablet by mouth at bedtime.     Marland Kitchen ibuprofen (ADVIL,MOTRIN) 200 MG tablet Take 3 tablets (600 mg total) by mouth every 8 (eight) hours as needed (with food).    . Probiotic Product (PROBIOTIC PO) Take by mouth.    . triamcinolone (KENALOG) topical spray Apply topically 2 (two) times daily. 63 g 0  . triamcinolone cream (KENALOG) 0.1 % Apply 1 application topically 2 (two) times daily. 30 g 0   No current facility-administered medications on file prior to visit.    Allergies  Allergen Reactions  . Codeine Nausea And Vomiting  . Gabapentin     Sedation at low doses  . Hydrocodone Nausea And Vomiting  . Morphine And Related Hives and Itching    Past Medical History  Diagnosis Date  . Allergy   . Gestational diabetes   . GERD (gastroesophageal reflux disease)   . Allergic rhinitis     Past Surgical History  Procedure Laterality Date  . Cesarean section  06, 09    x2  . Cholecystectomy  11/22/2011    Procedure: LAPAROSCOPIC CHOLECYSTECTOMY;  Surgeon: Rolm Bookbinder, MD;  Location: [facility] OR;  Service: General;  Laterality: N/A;    Family History  Problem  Relation Age of Onset  . Healthy Mother   . Cancer Maternal Grandmother     breast  . Heart disease Paternal Grandmother     Social History   Social History  . Marital Status: Married    Spouse Name: N/A  . Number of Children: 2  . Years of Education: N/A   Occupational History  . [employer]    Social History Main Topics  . Smoking status: Never Smoker   . Smokeless tobacco: Never Used  . Alcohol Use: Yes     Comment: occasional  . Drug Use: No  . Sexual Activity: Yes    Birth Control/ Protection: Pill   Other Topics Concern  . Not on file   Social History Narrative   The PMH, PSH, Social History, Family History, Medications, and allergies have been reviewed in [facility], and have been updated if relevant.   Review of Systems  Constitutional: Negative.   Respiratory: Negative.   Cardiovascular: Negative.   Gastrointestinal: Negative.   Genitourinary: Negative.   Musculoskeletal: Negative.   Skin: Positive for rash.  Allergic/Immunologic: Negative.   Neurological: Negative.   Psychiatric/Behavioral: Negative.   All other systems reviewed and are negative.      Objective:    BP 102/72 mmHg  Pulse 84  Temp(Src) 98.6 F (37 C) (Oral)  Wt 146 lb 8 oz (66.452 kg)  SpO2  98%  LMP 05/19/2015   Physical Exam  Constitutional: She is oriented to person, place, and time. She appears well-developed and well-nourished. No distress.  HENT:  Head: Normocephalic.  Eyes: Conjunctivae are normal.  Cardiovascular: Normal rate.   Pulmonary/Chest: Effort normal.  Neurological: She is alert and oriented to person, place, and time. No cranial nerve deficit.  Skin:  Scattered blistered appearing macules on chest, arms, back  Psychiatric: She has a normal mood and affect. Her behavior is normal. Judgment and thought content normal.  Nursing note and vitals reviewed.         Assessment & Plan:   Rash and nonspecific skin eruption No Follow-up on file.

## 2015-06-07 NOTE — Patient Instructions (Signed)
Bedbugs  Bedbugs are tiny bugs that live in and around beds. During the day, they hide in mattresses and other places near beds. They come out at night and bite people lying in bed. They need blood to live and grow. Bedbugs can be found in beds anywhere. Usually, they are found in places where many people come and go (hotels, shelters, hospitals). It does not matter whether the place is dirty or clean.  Getting bitten by bedbugs rarely causes a medical problem. The biggest problem can be getting rid of them.  This often takes the work of a pest control expert.  CAUSES  · Less use of pesticides. Bedbugs were common before the 1950s. Then, strong pesticides such as DDT nearly wiped them out. Today, these pesticides are not used because they harm the environment and can cause health problems.  · More travel. Besides mattresses, bedbugs can also live in clothing and luggage. They can come along as people travel from place to place. Bedbugs are more common in certain parts of the world. When people travel to those areas, the bugs can come home with them.  · Presence of birds and bats. Bedbugs often infest birds and bats. If you have these animals in or near your home, bedbugs may infest your house, too.  SYMPTOMS  It does not hurt to be bitten by a bedbug. You will probably not wake up when you are bitten. Bedbugs usually bite areas of the skin that are not covered. Symptoms may show when you wake up, or they may take a day or more to show up. Symptoms may include:  · Small red bumps on the skin. These might be lined up in a row or clustered in a group.  · A darker red dot in the middle of red bumps.  · Blisters on the skin. There may be swelling and very bad itching. These may be signs of an allergic reaction. This does not happen often.  DIAGNOSIS  Bedbug bites might look and feel like other types of insect bites. The bugs do not stay on the body like ticks or lice. They bite, drop off, and crawl away to hide. Your  caregiver will probably:  · Ask about your symptoms.  · Ask about your recent activities and travel.  · Check your skin for bedbug bites.  · Ask you to check at home for signs of bedbugs. You should look for:  ¨ Spots or stains on the bed or nearby. This could be from bedbugs that were crushed or from their eggs or waste.  ¨ Bedbugs themselves. They are reddish-brown, oval, and flat. They do not fly. They are about the size of an apple seed.  · Places to look for bedbugs include:  ¨ Beds. Check mattresses, headboards, box springs, and bed frames.  ¨ On drapes and curtains near the bed.  ¨ Under carpeting in the bedroom.  ¨ Behind electrical outlets.  ¨ Behind any wallpaper that is peeling.  ¨ Inside luggage.  TREATMENT  Most bedbug bites do not need treatment. They usually go away on their own in a few days. The bites are not dangerous. However, treatment may be needed if you have scratched so much that your skin has become infected. You may also need treatment if you are allergic to bedbug bites. Treatment options include:  · A drug that stops swelling and itching (corticosteroid). Usually, a cream is rubbed on the skin. If you have a bad rash, you may be   given a corticosteroid pill.  · Oral antihistamines. These are pills to help control itching.  · Antibiotic medicines. An antibiotic may be prescribed for infected skin.  HOME CARE INSTRUCTIONS   · Take any medicine prescribed by your caregiver for your bites. Follow the directions carefully.  · Consider wearing pajamas with long sleeves and pant legs.  · Your bedroom may need to be treated. A pest control expert should make sure the bedbugs are gone. You may need to throw away mattresses or luggage. Ask the pest control expert what you can do to keep the bedbugs from coming back. Common suggestions include:  ¨ Putting a plastic cover over your mattress.  ¨ Washing and drying your clothes and bedding in hot water and a hot dryer. The temperature should be hotter  than 120° F (48.9° C). Bedbugs are killed by high temperatures.  ¨ Vacuuming carefully all around your bed. Vacuum in all cracks and crevices where the bugs might hide. Do this often.  ¨ Carefully checking all used furniture, bedding, or clothes that you bring into your house.  ¨ Eliminating bird nests and bat roosts.  · If you get bedbug bites when traveling, check all your possessions carefully before bringing them into your house. If you find any bugs on clothes or in your luggage, consider throwing those items away.  SEEK MEDICAL CARE IF:  · You have red bug bites that keep coming back.  · You have red bug bites that itch badly.  · You have bug bites that cause a skin rash.  · You have scratch marks that are red and sore.  SEEK IMMEDIATE MEDICAL CARE IF:  You have a fever.  Document Released: 10/05/2010 Document Revised: 11/25/2011 Document Reviewed: 10/05/2010  Exit[employer]® Patient Information ©2015 [employer]. This information is not intended to replace advice given to you by your health care provider. Make sure you discuss any questions you have with your health care provider.

## 2015-06-14 ENCOUNTER — Encounter: Payer: Self-pay | Admitting: *Deleted

## 2015-06-14 DIAGNOSIS — Z006 Encounter for examination for normal comparison and control in clinical research program: Secondary | ICD-10-CM

## 2015-06-14 NOTE — Progress Notes (Signed)
STATUS FIRST Informed Consent    Subject Name:[name]   Subject met inclusion and exclusion criteria.The informed consent form, study requirements and expectations were reviewed with the subject and questions and concerns were addressed prior to the signing of the consent form. The subject verbalized understanding of the trail requirements. The subject agreed to participate in the STATUS FIRST research study and signed the informed consent at 8:54 on 06-14-15 The informed consent was obtained prior to performance of any protocol-specific procedures for the subject. A copy of the signed informed consent was given to the subject and a copy was placed in the subjects's medical record.    [name] 06/14/15 8:54

## 2015-07-11 ENCOUNTER — Other Ambulatory Visit: Payer: Self-pay | Admitting: Family Medicine

## 2015-07-11 ENCOUNTER — Other Ambulatory Visit: Payer: Self-pay | Admitting: Internal Medicine

## 2015-07-11 DIAGNOSIS — L309 Dermatitis, unspecified: Secondary | ICD-10-CM

## 2015-07-11 MED ORDER — TRIAMCINOLONE ACETONIDE 0.147 MG/GM EX AERS: INHALATION_SPRAY | Freq: Two times a day (BID) | CUTANEOUS | Status: DC

## 2015-07-11 MED ORDER — TRIAMCINOLONE ACETONIDE 0.1 % EX CREA: 1.0000 "application " | TOPICAL_CREAM | Freq: Two times a day (BID) | CUTANEOUS | Status: DC

## 2015-10-02 ENCOUNTER — Ambulatory Visit (INDEPENDENT_AMBULATORY_CARE_PROVIDER_SITE_OTHER): Payer: 59 | Admitting: Internal Medicine

## 2015-10-02 ENCOUNTER — Encounter: Payer: Self-pay | Admitting: Internal Medicine

## 2015-10-02 VITALS — BP 116/78 | HR 87 | Temp 98.4°F | Wt 146.0 lb

## 2015-10-02 DIAGNOSIS — M545 Low back pain, unspecified: Secondary | ICD-10-CM

## 2015-10-02 NOTE — Progress Notes (Signed)
Subjective:    Patient ID: [name], female    DOB: Sep 07, 1981, 35 y.o.   MRN: [id number]  HPI  Pt presents to the clinic today with c/o right lower back pain. This started yesterday. She describes the pain as sharp. It does radiate around to her groin. The area is tender to touch. She denies any numbness or tingling down her legs. She denies abdominal pain, urinary or vaginal complaints. She denies nausea, vomiting or diarrhea. She denies back injury but did go roller skating over the weekend with her kids. She has taken Ibuprofen with some relief.  Review of Systems      Past Medical History  Diagnosis Date  . Allergy   . Gestational diabetes   . GERD (gastroesophageal reflux disease)   . Allergic rhinitis     Current Outpatient Prescriptions  Medication Sig Dispense Refill  . cetirizine (ZYRTEC) 10 MG tablet Take 10 mg by mouth daily.    . cyclobenzaprine (FLEXERIL) 5 MG tablet Take 1 tablet (5 mg total) by mouth 3 (three) times daily as needed for muscle spasms. 30 tablet 1  . desogestrel-ethinyl estradiol (VELIVET,CAZIANT,CESIA,CYCLESSA) 0.1/0.125/0.15 -0.025 MG tablet Take 1 tablet by mouth at bedtime.     . fluocinonide-emollient (LIDEX-E) 0.05 % cream Apply 1 application topically 2 (two) times daily. 30 g 0  . ibuprofen (ADVIL,MOTRIN) 200 MG tablet Take 3 tablets (600 mg total) by mouth every 8 (eight) hours as needed (with food).    . Probiotic Product (PROBIOTIC PO) Take by mouth.    . triamcinolone (KENALOG) 0.147 MG/GM topical spray Apply topically 2 (two) times daily. 63 g 0  . triamcinolone cream (KENALOG) 0.1 % Apply 1 application topically 2 (two) times daily. 30 g 0   No current facility-administered medications for this visit.    Allergies  Allergen Reactions  . Codeine Nausea And Vomiting  . Gabapentin     Sedation at low doses  . Hydrocodone Nausea And Vomiting  . Morphine And Related Hives and Itching    Family History  Problem Relation Age  of Onset  . Healthy Mother   . Cancer Maternal Grandmother     breast  . Heart disease Paternal Grandmother     Social History   Social History  . Marital Status: Married    Spouse Name: N/A  . Number of Children: 2  . Years of Education: N/A   Occupational History  . [employer]    Social History Main Topics  . Smoking status: Never Smoker   . Smokeless tobacco: Never Used  . Alcohol Use: Yes     Comment: occasional  . Drug Use: No  . Sexual Activity: Yes    Birth Control/ Protection: Pill   Other Topics Concern  . Not on file   Social History Narrative     Constitutional: Denies fever, malaise, fatigue, headache or abrupt weight changes.  Respiratory: Denies difficulty breathing, shortness of breath, cough or sputum production.   Cardiovascular: Denies chest pain, chest tightness, palpitations or swelling in the hands or feet.  Gastrointestinal: Denies abdominal pain, bloating, constipation, diarrhea or blood in the stool.  GU: Denies urgency, frequency, pain with urination, burning sensation, blood in urine, odor or discharge. Musculoskeletal: Pt reports right side low back pain. Denies decrease in range of motion, difficulty with gait,  or joint pain and swelling.  Skin: Denies redness, rashes, lesions or ulcercations.    No other specific complaints in a complete review of systems (  except as listed in HPI above).  Objective:   Physical Exam    BP 116/78 mmHg  Pulse 87  Temp(Src) 98.4 F (36.9 C) (Oral)  Wt 146 lb (66.225 kg)  SpO2 99%  LMP 09/29/2015 Wt Readings from Last 3 Encounters:  10/02/15 146 lb (66.225 kg)  06/07/15 146 lb 8 oz (66.452 kg)  02/20/15 146 lb (66.225 kg)    General: Appears her stated age, well developed, well nourished in NAD. Cardiovascular: Normal rate and rhythm. S1,S2 noted.  No murmur, rubs or gallops noted. Pulmonary/Chest: Normal effort and positive vesicular breath sounds. No respiratory distress. No wheezes,  rales or ronchi noted.  Abdomen: Soft and nontender. No distention or masses noted. No CVA tenderness noted. Musculoskeletal: Normal flexion, extension and rotation of the spine. No bony tenderness noted. Pain with palpation of the paralumbar muscles (near L1). Strength 5/5 BLE. Neurological: Alert and oriented. Sensation intact.    BMET    Component Value Date/Time   NA 138 09/22/2014 1338   K 3.7 09/22/2014 1338   CL 106 09/22/2014 1338   CO2 25 09/22/2014 1338   GLUCOSE 95 09/22/2014 1338   BUN 10 09/22/2014 1338   CREATININE 0.7 09/22/2014 1338   CALCIUM 9.2 09/22/2014 1338   GFRNONAA 118.54 02/27/2010 1114   GFRAA  05/17/2008 0944    >60        The eGFR has been calculated using the MDRD equation. This calculation has not been validated in all clinical    Lipid Panel     Component Value Date/Time   CHOL 119 02/27/2010 1114   TRIG 68.0 02/27/2010 1114   HDL 51.30 02/27/2010 1114   CHOLHDL 2 02/27/2010 1114   VLDL 13.6 02/27/2010 1114   LDLCALC 54 02/27/2010 1114    CBC    Component Value Date/Time   WBC 8.4 09/22/2014 1338   RBC 4.75 09/22/2014 1338   HGB 14.1 09/22/2014 1338   HCT 43.0 09/22/2014 1338   PLT 253.0 09/22/2014 1338   MCV 90.6 09/22/2014 1338   MCH 30.6 11/15/2011 1451   MCHC 32.8 09/22/2014 1338   RDW 12.8 09/22/2014 1338   LYMPHSABS 3.0 09/22/2014 1338   MONOABS 0.4 09/22/2014 1338   EOSABS 0.1 09/22/2014 1338   BASOSABS 0.0 09/22/2014 1338    Hgb A1C Lab Results  Component Value Date   HGBA1C 5.5 02/27/2010       Assessment & Plan:   Right lower back pain:  ? Pinched nerve Continue Ibuprofen She has Flexeril at home, advised her to take 2 tabs QHS for the next 3 nights Back exercises given If persist, will obtain urinalysis, and xray of lumbar spine Return precautions given  RTC as needed or if symptoms persist or worsen

## 2015-10-02 NOTE — Patient Instructions (Signed)

## 2015-10-02 NOTE — Progress Notes (Signed)
Pre visit review using our clinic review tool, if applicable. No additional management support is needed unless otherwise documented below in the visit note. 

## 2016-01-26 ENCOUNTER — Other Ambulatory Visit (HOSPITAL_COMMUNITY): Payer: Self-pay | Admitting: Cardiology

## 2016-01-26 MED ORDER — DOXYCYCLINE HYCLATE 50 MG PO CAPS: 100.0000 mg | ORAL_CAPSULE | Freq: Two times a day (BID) | ORAL | Status: DC

## 2016-01-26 MED ORDER — FLUCONAZOLE 150 MG PO TABS: 150.0000 mg | ORAL_TABLET | ORAL | Status: DC | PRN

## 2016-01-26 MED FILL — DOXYCYCLINE HYC 50 MG CAP: 50 | 7 days supply | Qty: 28 | Fill #0

## 2016-01-26 MED FILL — FLUCONAZOLE 150 MG TABLET: 150 | 2 days supply | Qty: 2 | Fill #0

## 2016-02-19 ENCOUNTER — Other Ambulatory Visit (HOSPITAL_COMMUNITY): Payer: Self-pay

## 2016-02-19 ENCOUNTER — Ambulatory Visit (HOSPITAL_COMMUNITY)
Admission: RE | Admit: 2016-02-19 | Discharge: 2016-02-19 | Disposition: A | Discharge status: Home / Self Care | Payer: 59 | Source: Ambulatory Visit | Attending: Internal Medicine | Admitting: Internal Medicine

## 2016-02-19 DIAGNOSIS — R5383 Other fatigue: Secondary | ICD-10-CM | POA: Diagnosis not present

## 2016-02-19 LAB — T4, FREE: Free T4: 0.86 ng/dL (ref 0.61–1.12)

## 2016-02-19 LAB — TSH: TSH: 1.431 u[IU]/mL (ref 0.350–4.500)

## 2016-02-20 LAB — T3, FREE: T3 FREE: 3.3 pg/mL (ref 2.0–4.4)

## 2016-02-22 DIAGNOSIS — Z1321 Encounter for screening for nutritional disorder: Secondary | ICD-10-CM | POA: Diagnosis not present

## 2016-02-22 DIAGNOSIS — Z1151 Encounter for screening for human papillomavirus (HPV): Secondary | ICD-10-CM | POA: Diagnosis not present

## 2016-02-22 DIAGNOSIS — L709 Acne, unspecified: Secondary | ICD-10-CM | POA: Diagnosis not present

## 2016-02-22 DIAGNOSIS — Z6824 Body mass index (BMI) 24.0-24.9, adult: Secondary | ICD-10-CM | POA: Diagnosis not present

## 2016-02-22 DIAGNOSIS — Z130 Encounter for screening for diseases of the blood and blood-forming organs and certain disorders involving the immune mechanism: Secondary | ICD-10-CM | POA: Diagnosis not present

## 2016-02-22 DIAGNOSIS — L659 Nonscarring hair loss, unspecified: Secondary | ICD-10-CM | POA: Diagnosis not present

## 2016-02-22 DIAGNOSIS — Z01419 Encounter for gynecological examination (general) (routine) without abnormal findings: Secondary | ICD-10-CM | POA: Diagnosis not present

## 2016-02-28 MED FILL — SPIRONOLACTONE 50 MG TABLET: 50 | 30 days supply | Qty: 60 | Fill #0

## 2016-03-06 ENCOUNTER — Other Ambulatory Visit (HOSPITAL_COMMUNITY): Payer: Self-pay

## 2016-03-06 MED ORDER — SULFAMETHOXAZOLE-TRIMETHOPRIM 800-160 MG PO TABS: 1.0000 | ORAL_TABLET | Freq: Two times a day (BID) | ORAL | Status: DC

## 2016-03-06 MED ORDER — FLUCONAZOLE 150 MG PO TABS: 150.0000 mg | ORAL_TABLET | ORAL | Status: DC | PRN

## 2016-03-06 MED FILL — SULFAMETHOXAZOLE/TMP DS TAB: 800-160 | 10 days supply | Qty: 20 | Fill #0

## 2016-03-06 MED FILL — FLUCONAZOLE 150 MG TABLET: 150 | 1 days supply | Qty: 1 | Fill #0

## 2016-03-07 ENCOUNTER — Ambulatory Visit (HOSPITAL_COMMUNITY)
Admission: RE | Admit: 2016-03-07 | Discharge: 2016-03-07 | Disposition: A | Discharge status: Home / Self Care | Payer: 59 | Source: Ambulatory Visit | Attending: Internal Medicine | Admitting: Internal Medicine

## 2016-03-07 ENCOUNTER — Other Ambulatory Visit (HOSPITAL_COMMUNITY): Payer: Self-pay | Admitting: *Deleted

## 2016-03-07 DIAGNOSIS — R5383 Other fatigue: Secondary | ICD-10-CM

## 2016-03-07 LAB — BASIC METABOLIC PANEL
Anion gap: 7 (ref 5–15)
BUN: 13 mg/dL (ref 6–20)
CHLORIDE: 102 mmol/L (ref 101–111)
CO2: 28 mmol/L (ref 22–32)
CREATININE: 0.85 mg/dL (ref 0.44–1.00)
Calcium: 9.6 mg/dL (ref 8.9–10.3)
GFR calc non Af Amer: >60 mL/min (ref >60)
Glucose, Bld: 84 mg/dL (ref 65–99)
Potassium: 3.8 mmol/L (ref 3.5–5.1)
Sodium: 137 mmol/L (ref 135–145)

## 2016-03-27 MED FILL — SPIRONOLACTONE 50 MG TABLET: 50 | 30 days supply | Qty: 60 | Fill #1

## 2016-04-30 MED FILL — SPIRONOLACTONE 50 MG TABLET: 50 | 30 days supply | Qty: 60 | Fill #2

## 2016-05-02 MED FILL — FLUCONAZOLE 150 MG TABLET: 150 | 1 days supply | Qty: 1 | Fill #1

## 2016-05-13 MED FILL — NYSTATIN 100,000 UNITS/GM O: 100000 | 10 days supply | Qty: 30 | Fill #0

## 2016-05-13 MED FILL — TRIAMCINOLONE 0.1% OINTMENT: 0.1 | 10 days supply | Qty: 30 | Fill #0

## 2016-06-03 MED FILL — SPIRONOLACTONE 50 MG TABLET: 50 | 30 days supply | Qty: 60 | Fill #3

## 2016-07-12 MED FILL — SPIRONOLACTONE 50 MG TABLET: 50 | 30 days supply | Qty: 60 | Fill #4

## 2016-07-16 ENCOUNTER — Other Ambulatory Visit: Payer: Self-pay | Admitting: Family Medicine

## 2016-07-17 MED FILL — TRIAMCINOLONE 0.147 MG/G SP: 0.147 | 25 days supply | Qty: 63 | Fill #0

## 2016-08-23 MED FILL — SPIRONOLACTONE 50 MG TABLET: 50 | 30 days supply | Qty: 60 | Fill #5

## 2016-08-29 DIAGNOSIS — H5213 Myopia, bilateral: Secondary | ICD-10-CM | POA: Diagnosis not present

## 2016-08-29 DIAGNOSIS — H52223 Regular astigmatism, bilateral: Secondary | ICD-10-CM | POA: Diagnosis not present

## 2016-08-30 DIAGNOSIS — L709 Acne, unspecified: Secondary | ICD-10-CM | POA: Diagnosis not present

## 2016-09-17 ENCOUNTER — Encounter: Payer: Self-pay | Admitting: Family Medicine

## 2016-09-17 ENCOUNTER — Ambulatory Visit (INDEPENDENT_AMBULATORY_CARE_PROVIDER_SITE_OTHER): Payer: 59 | Admitting: Family Medicine

## 2016-09-17 DIAGNOSIS — R109 Unspecified abdominal pain: Secondary | ICD-10-CM

## 2016-09-17 DIAGNOSIS — R1030 Lower abdominal pain, unspecified: Secondary | ICD-10-CM | POA: Insufficient documentation

## 2016-09-17 LAB — H. PYLORI ANTIBODY, IGG: H Pylori IgG: NEGATIVE

## 2016-09-17 MED ORDER — OMEPRAZOLE 20 MG PO CPDR: 20.0000 mg | DELAYED_RELEASE_CAPSULE | Freq: Every day | ORAL | 3 refills | Status: DC

## 2016-09-17 MED FILL — OMEPRAZOLE DR 20 MG CAPSULE: 20 | 30 days supply | Qty: 30 | Fill #0

## 2016-09-17 NOTE — Patient Instructions (Signed)
Great to see you.  Happy New Year!  Let's start omeprazole 20 mg daily for 2 weeks.  I will call you lab results.  Try over the counter gas-x (four times a day when necessary) or beano for bloating.

## 2016-09-17 NOTE — Assessment & Plan Note (Signed)
New- exam reassuring. ? GERD Advised omeprazole 20 mg daily x 2 weeks and gas x or beano ( see AVS). Will also check H Pylori antibodies. Call or return to clinic prn if these symptoms worsen or fail to improve as anticipated. The patient indicates understanding of these issues and agrees with the plan.

## 2016-09-17 NOTE — Progress Notes (Signed)
Subjective:   Patient ID: [name], female    DOB: 1980/12/02, 36 y.o.   MRN: [id number]  [name] is a pleasant 36 y.o. year old female who presents to clinic today with Abdominal Pain and Gastroesophageal Reflux  on 09/17/2016  HPI:  Abdominal pain- Intermittent nonspecific abdominal pain/bloating and burping since last week. Some mild nausea. No vomiting or diarrhea.  Remote h/o laparoscopic cholecystectomy (2013).  Unsure if pain is worse when she has not eaten. Possibly certain food triggers- has been eating heavier foods during the holidays.  Has tried tums for it without much relief.  Started her period today- pain is a little better. Current Outpatient Prescriptions on File Prior to Visit  Medication Sig Dispense Refill  . cetirizine (ZYRTEC) 10 MG tablet Take 10 mg by mouth daily.    . cyclobenzaprine (FLEXERIL) 5 MG tablet Take 1 tablet (5 mg total) by mouth 3 (three) times daily as needed for muscle spasms. 30 tablet 1  . ibuprofen (ADVIL,MOTRIN) 200 MG tablet Take 3 tablets (600 mg total) by mouth every 8 (eight) hours as needed (with food).    . Probiotic Product (PROBIOTIC PO) Take by mouth.    . triamcinolone (KENALOG) 0.147 MG/GM topical spray APPLY TOPICALLY 2 TIMES DAILY. 63 g 0   No current facility-administered medications on file prior to visit.     Allergies  Allergen Reactions  . Codeine Nausea And Vomiting  . Gabapentin     Sedation at low doses  . Hydrocodone Nausea And Vomiting  . Morphine And Related Hives and Itching    Past Medical History:  Diagnosis Date  . Allergic rhinitis   . Allergy   . GERD (gastroesophageal reflux disease)   . Gestational diabetes     Past Surgical History:  Procedure Laterality Date  . CESAREAN SECTION  06, 09   x2  . CHOLECYSTECTOMY  11/22/2011   Procedure: LAPAROSCOPIC CHOLECYSTECTOMY;  Surgeon: Rolm Bookbinder, MD;  Location: [facility] OR;  Service: General;  Laterality: N/A;    Family History    Problem Relation Age of Onset  . Healthy Mother   . Cancer Maternal Grandmother     breast  . Heart disease Paternal Grandmother     Social History   Social History  . Marital status: Married    Spouse name: N/A  . Number of children: 2  . Years of education: N/A   Occupational History  . [place] [employer]    Social History Main Topics  . Smoking status: Never Smoker  . Smokeless tobacco: Never Used  . Alcohol use Yes     Comment: occasional  . Drug use: No  . Sexual activity: Yes    Birth control/ protection: Pill   Other Topics Concern  . Not on file   Social History Narrative  . No narrative on file   The PMH, PSH, Social History, Family History, Medications, and allergies have been reviewed in [facility], and have been updated if relevant.  Review of Systems  Constitutional: Negative.   Gastrointestinal: Positive for abdominal distention, abdominal pain and nausea. Negative for anal bleeding, blood in stool, constipation, diarrhea, rectal pain and vomiting.  Genitourinary: Negative.   Skin: Negative.   All other systems reviewed and are negative.      Objective:    BP 114/66   Pulse 81   Temp 97.8 F (36.6 C) (Oral)   Wt 134 lb 8 oz (61 kg)   LMP 09/17/2016   SpO2  99%   BMI 24.60 kg/m    Physical Exam  Constitutional: She is oriented to person, place, and time. She appears well-developed and well-nourished. No distress.  HENT:  Head: Normocephalic and atraumatic.  Eyes: Conjunctivae are normal.  Cardiovascular: Normal rate.   Pulmonary/Chest: Effort normal.  Abdominal: Soft. Bowel sounds are normal. She exhibits no distension and no mass. There is tenderness. There is no rebound and no guarding.  Musculoskeletal: Normal range of motion.  Neurological: She is alert and oriented to person, place, and time. No cranial nerve deficit.  Skin: Skin is warm and dry. She is not diaphoretic.  Psychiatric: She has a normal mood and affect. Her behavior is  normal. Judgment and thought content normal.  Nursing note and vitals reviewed.         Assessment & Plan:   Abdominal pain, unspecified abdominal location No Follow-up on file.

## 2016-09-18 ENCOUNTER — Encounter: Payer: Self-pay | Admitting: Family Medicine

## 2016-09-20 ENCOUNTER — Other Ambulatory Visit (HOSPITAL_COMMUNITY): Payer: Self-pay | Admitting: *Deleted

## 2016-09-20 ENCOUNTER — Ambulatory Visit (HOSPITAL_COMMUNITY)
Admission: RE | Admit: 2016-09-20 | Discharge: 2016-09-20 | Disposition: A | Discharge status: Home / Self Care | Payer: 59 | Source: Ambulatory Visit | Attending: Internal Medicine | Admitting: Internal Medicine

## 2016-09-20 DIAGNOSIS — R1084 Generalized abdominal pain: Secondary | ICD-10-CM | POA: Insufficient documentation

## 2016-09-20 LAB — CBC
HCT: 43.6 % (ref 36.0–46.0)
Hemoglobin: 15.1 g/dL — ABNORMAL HIGH (ref 12.0–15.0)
MCH: 31.3 pg (ref 26.0–34.0)
MCHC: 34.6 g/dL (ref 30.0–36.0)
MCV: 90.5 fL (ref 78.0–100.0)
PLATELETS: 245 10*3/uL (ref 150–400)
RBC: 4.82 MIL/uL (ref 3.87–5.11)
RDW: 12.2 % (ref 11.5–15.5)
WBC: 8.2 10*3/uL (ref 4.0–10.5)

## 2016-09-20 LAB — COMPREHENSIVE METABOLIC PANEL
ALBUMIN: 4.4 g/dL (ref 3.5–5.0)
ALK PHOS: 60 U/L (ref 38–126)
ALT: 17 U/L (ref 14–54)
AST: 17 U/L (ref 15–41)
Anion gap: 8 (ref 5–15)
BUN: 17 mg/dL (ref 6–20)
CALCIUM: 9.4 mg/dL (ref 8.9–10.3)
CO2: 26 mmol/L (ref 22–32)
Chloride: 103 mmol/L (ref 101–111)
Creatinine, Ser: 0.86 mg/dL (ref 0.44–1.00)
GFR calc Af Amer: >60 mL/min (ref >60)
GLUCOSE: 92 mg/dL (ref 65–99)
POTASSIUM: 3.8 mmol/L (ref 3.5–5.1)
Sodium: 137 mmol/L (ref 135–145)
Total Bilirubin: 0.9 mg/dL (ref 0.3–1.2)
Total Protein: 7.7 g/dL (ref 6.5–8.1)

## 2016-09-20 LAB — AMYLASE: AMYLASE: 24 U/L — AB (ref 28–100)

## 2016-09-20 MED ORDER — SUCRALFATE 1 GM/10ML PO SUSP: 1.0000 g | Freq: Three times a day (TID) | ORAL | 0 refills | Status: DC

## 2016-09-20 MED FILL — CARAFATE 1 GM/10 ML SUSP: 1 | 12 days supply | Qty: 420 | Fill #0

## 2016-09-24 ENCOUNTER — Other Ambulatory Visit: Payer: Self-pay | Admitting: Family Medicine

## 2016-09-24 DIAGNOSIS — R109 Unspecified abdominal pain: Secondary | ICD-10-CM

## 2016-09-25 ENCOUNTER — Encounter: Payer: Self-pay | Admitting: Physician Assistant

## 2016-09-26 ENCOUNTER — Encounter: Payer: Self-pay | Admitting: Physician Assistant

## 2016-09-26 ENCOUNTER — Ambulatory Visit (INDEPENDENT_AMBULATORY_CARE_PROVIDER_SITE_OTHER): Payer: 59 | Admitting: Physician Assistant

## 2016-09-26 VITALS — BP 100/60 | HR 68 | Ht 62.75 in | Wt 131.5 lb

## 2016-09-26 DIAGNOSIS — R1031 Right lower quadrant pain: Secondary | ICD-10-CM | POA: Diagnosis not present

## 2016-09-26 DIAGNOSIS — R142 Eructation: Secondary | ICD-10-CM

## 2016-09-26 DIAGNOSIS — R110 Nausea: Secondary | ICD-10-CM | POA: Diagnosis not present

## 2016-09-26 DIAGNOSIS — R1013 Epigastric pain: Secondary | ICD-10-CM | POA: Diagnosis not present

## 2016-09-26 MED ORDER — OMEPRAZOLE 40 MG PO CPDR: DELAYED_RELEASE_CAPSULE | ORAL | 2 refills | Status: DC

## 2016-09-26 MED FILL — OMEPRAZOLE DR 40 MG CAPSULE: 40 | 30 days supply | Qty: 60 | Fill #0

## 2016-09-26 NOTE — Progress Notes (Signed)
Reviewed and agree with documentation and assessment and plan. K. Veena Grainfield Paone , MD   

## 2016-09-26 NOTE — Patient Instructions (Addendum)
We have sent the following medications to your pharmacy for you to pick up at your convenience:  Omeprazole  Continue your Carafate twice a day 1 hour before or 2 hours after eating and taking other meds.  You have been scheduled for a CT scan of the abdomen and pelvis at [place] are scheduled on 10/02/2016 at 3:00pm. You should arrive 15 minutes prior to your appointment time for registration. Please follow the written instructions below on the day of your exam:  WARNING: IF YOU ARE ALLERGIC TO IODINE/X-RAY DYE, PLEASE NOTIFY RADIOLOGY IMMEDIATELY AT [phone]! YOU WILL BE GIVEN A 13 HOUR PREMEDICATION PREP.  1) Do not eat or drink anything after 11:00am(4 hours prior to your test) 2) You have been given 2 bottles of oral contrast to drink. The solution may taste better if refrigerated, but do NOT add ice or any other liquid to this solution. Shake well before drinking.    Drink 1 bottle of contrast @ 1:00pm (2 hours prior to your exam)  Drink 1 bottle of contrast @ 2:00pm (1 hour prior to your exam)  You may take any medications as prescribed with a small amount of water except for the following: Metformin, Glucophage, Glucovance, Avandamet, Riomet, Fortamet, Actoplus Met, Janumet, Glumetza or Metaglip. The above medications must be held the day of the exam AND 48 hours after the exam.  The purpose of you drinking the oral contrast is to aid in the visualization of your intestinal tract. The contrast solution may cause some diarrhea. Before your exam is started, you will be given a small amount of fluid to drink. Depending on your individual set of symptoms, you may also receive an intravenous injection of x-ray contrast/dye. Plan on being at [facility] for 30 minutes or long, depending on the type of exam you are having performed.  If you have any questions regarding your exam or if you need to reschedule, you may call the CT department at [phone] between the  hours of 8:00 am and 5:00 pm, Monday-Friday.   Please follow up with Anderson Malta on 10/09/2016 at 1:15pm ________________________________________________________________________

## 2016-09-26 NOTE — Progress Notes (Signed)
Chief Complaint: Abdominal Pain, Eructations, Nausea  HPI: [name] is a 36 year old Caucasian female with a past medical history of reflux, who was referred to me by Lucille Passy, MD for a complaint of abdominal pain, eructations and nausea.   Patient recently saw her PCP on 09/17/16 for a complaint of abdominal pain and eructations and at that time had labs to include an amylase which was normal, CBC normal, CMP normal and H. pylori antibody which was normal. Patient was started on Omeprazole 20 mg twice a day and instructed to use Beano.   Today, the patient presents to clinic and tells me that over the past month she has developed an abdominal pain/tenderness which is very intermittent in nature. She describes this as a "burning/fire", occasionally she will have "sharp twinges" of pain. She tells me that these episodes can last for a few hours or go with the way within 15-20 minutes. These occur throughout the day but tend to be worse at night. She has had no appetite around dinnertime for the past few weeks. She describes that when this abdominal pain is severe she has nausea and also sometimes in the morning upon waking. Associated symptoms include an increase in eructations over the past few weeks. Patient has changed to a bland diet which does not seem to be helping. She tells me that this past weekend she felt as though the Omeprazole was helping. 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 In fact, on Saturday she felt so good that she had a beer at night for dinner, all of her symptoms returned Monday morning. Today, she has had nonstop gas. Patient tells me that her bowel habits are normal for her. She did have 2 full bowel movements today. Patient has stopped using her occasional ibuprofen which she only took once a month or so. She is taking her Carafate now for 2 hours after eating and other meds. Patient tells me this pain is  at least a 10/10 at times which causes her to clutch her upper abdomen.   Past medical history significant for status post cholecystectomy. Social history is positive for working as an Therapist, sports at [employer].   Patient denies fever, chills, blood in her stool, melena, change in diet, recent antibiotics, weight loss, fatigue, anorexia, vomiting, reflux, dysphagia or symptoms that awaken her at night.  Past Medical History:  Diagnosis Date  . Allergic rhinitis   . Allergy   . GERD (gastroesophageal reflux disease)   . Gestational diabetes     Past Surgical History:  Procedure Laterality Date  . CESAREAN SECTION  06, 09   x2  . CHOLECYSTECTOMY  11/22/2011   Procedure: LAPAROSCOPIC CHOLECYSTECTOMY;  Surgeon: Rolm Bookbinder, MD;  Location[facility] OR;  Service: General;  Laterality: N/A;    Current Outpatient Prescriptions  Medication Sig Dispense Refill  . cetirizine (ZYRTEC) 10 MG tablet Take 10 mg by mouth daily.    . cyanocobalamin 2000 MCG tablet Take 2,000 mcg by mouth daily.    . cyclobenzaprine (FLEXERIL) 5 MG tablet Take 1 tablet (5 mg total) by mouth 3 (three) times daily as needed for muscle spasms. 30 tablet 1  . ibuprofen (ADVIL,MOTRIN) 200 MG tablet Take 3 tablets (600 mg total) by mouth every 8 (eight) hours as needed (with food).    Marland Kitchen omeprazole (PRILOSEC) 20 MG capsule Take 1 capsule (20 mg total) by mouth  daily. 30 capsule 3  . Probiotic Product (PROBIOTIC PO) Take by mouth.    . spironolactone (ALDACTONE) 50 MG tablet Take 50 mg by mouth 2 (two) times daily.  5  . sucralfate (CARAFATE) 1 GM/10ML suspension Take 10 mLs (1 g total) by mouth 4 (four) times daily -  with meals and at bedtime. 420 mL 0  . triamcinolone (KENALOG) 0.147 MG/GM topical spray APPLY TOPICALLY 2 TIMES DAILY. 63 g 0   No current facility-administered medications for this visit.     Allergies as of 09/26/2016 - Review Complete 09/26/2016  Allergen Reaction Noted  . Codeine Nausea And Vomiting     . Gabapentin  03/03/2014  . Hydrocodone Nausea And Vomiting 11/18/2011  . Morphine and related Hives and Itching 11/18/2011    Family History  Problem Relation Age of Onset  . Healthy Mother   . Colon polyps Father   . Other Father     brain tumor  . Breast cancer Maternal Grandmother   . Heart disease Paternal Grandmother   . Diabetes Paternal Grandmother   . Stroke Paternal Grandmother     Social History   Social History  . Marital status: Married    Spouse name: N/A  . Number of children: 2  . Years of education: N/A   Occupational History  . RN    Social History Main Topics  . Smoking status: Never Smoker  . Smokeless tobacco: Never Used  . Alcohol use Yes     Comment: occasional  . Drug use: No  . Sexual activity: Yes    Birth control/ protection: Pill   Other Topics Concern  . Not on file   Social History Narrative  . No narrative on file    Review of Systems:    Constitutional: No weight loss, fever or chills Skin: No rash or itching Cardiovascular: No chest pain Respiratory: No SOB Gastrointestinal: See HPI and otherwise negative Genitourinary: No dysuria or change in urinary frequency Neurological: Positive for headaches No dizziness or syncope Musculoskeletal: No new muscle or joint pain Hematologic: No bleeding or bruising Psychiatric: No history of depression or anxiety   Physical Exam:  Vital signs: BP 100/60 (BP Location: Left Arm, Patient Position: Sitting, Cuff Size: Normal)   Pulse 68   Ht 5' 2.75" (1.594 m) Comment: height measured without shoes  Wt 131 lb 8 oz (59.6 kg)   LMP 09/17/2016   BMI 23.48 kg/m   Constitutional:   Pleasant Caucasian female appears to be in Mild distress, Well developed, Well nourished, alert and cooperative Head:  Normocephalic and atraumatic. Eyes:   PEERL, EOMI. No icterus. Conjunctiva pink. Ears:  Normal auditory acuity. Neck:  Supple Throat: Oral cavity and pharynx without inflammation, swelling  or lesion.  Respiratory: Respirations even and unlabored. Lungs clear to auscultation bilaterally.   No wheezes, crackles, or rhonchi.  Cardiovascular: Normal S1, S2. No MRG. Regular rate and rhythm. No peripheral edema, cyanosis or pallor.  Gastrointestinal:  Soft, mild distention, marked tenderness in the right lower quadrant positive rebound, marked tenderness in the epigastrium with involuntary guarding Normal bowel sounds. No appreciable masses or hepatomegaly. Rectal:  Not performed.  Msk:  Symmetrical without gross deformities. Without edema, no deformity or joint abnormality.  Neurologic:  Alert and  oriented x4;  grossly normal neurologically.  Skin:   Dry and intact without significant lesions or rashes. Psychiatric: Demonstrates good judgement and reason without abnormal affect or behaviors.  RELEVANT LABS AND IMAGING: CBC  Component Value Date/Time   WBC 8.2 09/20/2016 0825   RBC 4.82 09/20/2016 0825   HGB 15.1 (H) 09/20/2016 0825   HCT 43.6 09/20/2016 0825   PLT 245 09/20/2016 0825   MCV 90.5 09/20/2016 0825   MCH 31.3 09/20/2016 0825   MCHC 34.6 09/20/2016 0825   RDW 12.2 09/20/2016 0825   LYMPHSABS 3.0 09/22/2014 1338   MONOABS 0.4 09/22/2014 1338   EOSABS 0.1 09/22/2014 1338   BASOSABS 0.0 09/22/2014 1338    CMP     Component Value Date/Time   NA 137 09/20/2016 0825   K 3.8 09/20/2016 0825   CL 103 09/20/2016 0825   CO2 26 09/20/2016 0825   GLUCOSE 92 09/20/2016 0825   BUN 17 09/20/2016 0825   CREATININE 0.86 09/20/2016 0825   CALCIUM 9.4 09/20/2016 0825   PROT 7.7 09/20/2016 0825   ALBUMIN 4.4 09/20/2016 0825   AST 17 09/20/2016 0825   ALT 17 09/20/2016 0825   ALKPHOS 60 09/20/2016 0825   BILITOT 0.9 09/20/2016 0825   GFRNONAA >60 09/20/2016 0825   GFRAA >60 09/20/2016 0825    Assessment: 1. Epigastric pain: Episodic, associated with nausea, sometimes a 10/10, somewhat helped by Omeprazole 20mg  qd and Carafate BID; consider gastritis versus H.  pylori versus PUD versus other 2. Right lower quadrant abdominal pain: Marked at time of exam with rebound and guarding, cannot rule out appendicitis; question whether this is related to above although patient had not mentioned pain before time of exam 3. Nausea: Likely related to epigastric pain 4. Eructations: with above  Plan: 1. Increased Omeprazole to 40 mg twice a day, 30-60 minutes before eating breakfast and dinner #60 with 2 refills 2. Patient may continue Carafate twice a day, an hour before or  2 hours after eating and other medications 3. Provided patient with a handout regarding antireflux diet and lifestyle modifications 4. Ordered CT of the abdomen and pelvis due to severe right lower quadrant abdominal pain as well as epigastric pain on exam today 5. Ordered h.pylori fecal antigen 6. Patient to return to clinic in 2-3 weeks or sooner if necessary with myself or Dr. Silverio Decamp as she was a previous [facility] patient  [name], Hershal Coria [facility] 09/26/2016, 10:46 AM  Cc: Lucille Passy, MD

## 2016-09-27 ENCOUNTER — Ambulatory Visit (HOSPITAL_COMMUNITY)
Admission: RE | Admit: 2016-09-27 | Discharge: 2016-09-27 | Disposition: A | Discharge status: Home / Self Care | Payer: 59 | Source: Ambulatory Visit | Attending: Physician Assistant | Admitting: Physician Assistant

## 2016-09-27 DIAGNOSIS — R110 Nausea: Secondary | ICD-10-CM | POA: Diagnosis not present

## 2016-09-27 DIAGNOSIS — R1031 Right lower quadrant pain: Secondary | ICD-10-CM | POA: Insufficient documentation

## 2016-09-27 DIAGNOSIS — K5730 Diverticulosis of large intestine without perforation or abscess without bleeding: Secondary | ICD-10-CM | POA: Diagnosis not present

## 2016-09-27 DIAGNOSIS — R1013 Epigastric pain: Secondary | ICD-10-CM | POA: Insufficient documentation

## 2016-09-27 MED ORDER — IOPAMIDOL (ISOVUE-300) INJECTION 61%
100.0000 mL | Freq: Once | INTRAVENOUS | Status: AC | PRN
  Administered 2016-09-27: 100 mL via INTRAVENOUS

## 2016-10-01 MED FILL — SPIRONOLACTONE 50 MG TABLET: 50 | 30 days supply | Qty: 60 | Fill #0

## 2016-10-02 ENCOUNTER — Ambulatory Visit (HOSPITAL_COMMUNITY): Payer: 59

## 2016-10-03 ENCOUNTER — Ambulatory Visit: Payer: 59 | Admitting: Physician Assistant

## 2016-10-09 ENCOUNTER — Ambulatory Visit (INDEPENDENT_AMBULATORY_CARE_PROVIDER_SITE_OTHER): Payer: 59 | Admitting: Physician Assistant

## 2016-10-09 ENCOUNTER — Encounter: Payer: Self-pay | Admitting: Physician Assistant

## 2016-10-09 VITALS — BP 100/60 | HR 70 | Ht 63.5 in | Wt 131.0 lb

## 2016-10-09 DIAGNOSIS — R1013 Epigastric pain: Secondary | ICD-10-CM | POA: Diagnosis not present

## 2016-10-09 DIAGNOSIS — R1031 Right lower quadrant pain: Secondary | ICD-10-CM

## 2016-10-09 DIAGNOSIS — R142 Eructation: Secondary | ICD-10-CM

## 2016-10-09 DIAGNOSIS — K219 Gastro-esophageal reflux disease without esophagitis: Secondary | ICD-10-CM | POA: Diagnosis not present

## 2016-10-09 DIAGNOSIS — R110 Nausea: Secondary | ICD-10-CM

## 2016-10-09 NOTE — Progress Notes (Signed)
Chief Complaint: Abdominal pain, eructation's and nausea  HPI:  [name] is a 36 year old Caucasian female who returns to clinic today for follow-up of her abdominal pain, eructation's and nausea. Please recall patient was last seen in clinic by myself on 09/26/16 and was assigned to Dr. Silverio Decamp. At that time patient described that for fourmonths she had had epigastric abdominal tenderness which was intermittent in nature. She described this as a "burning/fire" with occasional "sharp twinges" of pain. These episodes would last at least 15-20 minutes. They occured throughout the day, but tended to be worse at night. She also had a decrease in appetite and told me that sometimes this pain was so severe she would become nauseous. Associated symptoms included an increase in eructations. She had recently been placed on Omeprazole 20 mg and Carafate 3 times a day which seemed to be helping some. At time of exam that day patient had excruciating right lower quadrant pain and a CT was ordered to rule out appendicitis. She was also increase to Omeprazole 40 mg twice a day and an H. pylori fecal antigen was ordered. Patient never completed the fecal antigen. CT of the abdomen and pelvis showed no evidence for acute intra-abdominal or pelvic pathology and sigmoid colon diverticular disease without acute inflammation.   Today, the patient tells me that she has been using the Omeprazole 40 mg twice a day and feels like this has continude to help some but she still intermittently has days of abdominal pain, mostly in her epigastrium associated with a loss of appetite and nausea. Last night was specifically bad for her. Patient denies continued pain in her right lower quadrant that she has noticed. Patient tells me it does not seem to matter what she eats. She will have her "good days and bad". She has not been using Carafate.   Patient denies fever, chills, and her stool, melena, change in bowel habits, weight loss,  vomiting or symptoms that awaken her at night.  Past Medical History:  Diagnosis Date  . Allergic rhinitis   . Allergy   . GERD (gastroesophageal reflux disease)   . Gestational diabetes     Past Surgical History:  Procedure Laterality Date  . CESAREAN SECTION  06, 09   x2  . CHOLECYSTECTOMY  11/22/2011   Procedure: LAPAROSCOPIC CHOLECYSTECTOMY;  Surgeon: Rolm Bookbinder, MD;  Location: [facility] OR;  Service: General;  Laterality: N/A;    Current Outpatient Prescriptions  Medication Sig Dispense Refill  . cetirizine (ZYRTEC) 10 MG tablet Take 10 mg by mouth daily.    . cyanocobalamin 2000 MCG tablet Take 2,000 mcg by mouth daily.    . cyclobenzaprine (FLEXERIL) 5 MG tablet Take 1 tablet (5 mg total) by mouth 3 (three) times daily as needed for muscle spasms. 30 tablet 1  . ibuprofen (ADVIL,MOTRIN) 200 MG tablet Take 3 tablets (600 mg total) by mouth every 8 (eight) hours as needed (with food).    Marland Kitchen omeprazole (PRILOSEC) 20 MG capsule Take 1 capsule (20 mg total) by mouth daily. 30 capsule 3  . omeprazole (PRILOSEC) 40 MG capsule Take one capsule by mouth 30-60 minutes before breakfast and dinner 60 capsule 2  . Probiotic Product (PROBIOTIC PO) Take by mouth.    . spironolactone (ALDACTONE) 50 MG tablet Take 50 mg by mouth 2 (two) times daily.  5  . sucralfate (CARAFATE) 1 GM/10ML suspension Take 10 mLs (1 g total) by mouth 4 (four) times daily -  with meals and at bedtime.  420 mL 0  . triamcinolone (KENALOG) 0.147 MG/GM topical spray APPLY TOPICALLY 2 TIMES DAILY. 63 g 0   No current facility-administered medications for this visit.     Allergies as of 10/09/2016 - Review Complete 10/09/2016  Allergen Reaction Noted  . Codeine Nausea And Vomiting   . Gabapentin  03/03/2014  . Hydrocodone Nausea And Vomiting 11/18/2011  . Morphine and related Hives and Itching 11/18/2011    Family History  Problem Relation Age of Onset  . Healthy Mother   . Colon polyps Father   . Other Father      brain tumor  . Breast cancer Maternal Grandmother   . Heart disease Paternal Grandmother   . Diabetes Paternal Grandmother   . Stroke Paternal Grandmother     Social History   Social History  . Marital status: Married    Spouse name: N/A  . Number of children: 2  . Years of education: N/A   Occupational History  . RN    Social History Main Topics  . Smoking status: Never Smoker  . Smokeless tobacco: Never Used  . Alcohol use Yes     Comment: occasional  . Drug use: No  . Sexual activity: Yes    Birth control/ protection: Pill   Other Topics Concern  . Not on file   Social History Narrative  . No narrative on file    Review of Systems:    Constitutional: No weight loss, fever, chills, weakness or fatigue Cardiovascular: No chest pain Respiratory: No SOB  Gastrointestinal: See HPI and otherwise negative   Physical Exam:  Vital signs: BP 100/60   Pulse 70   Ht 5' 3.5" (1.613 m)   Wt 131 lb (59.4 kg)   LMP 09/17/2016   BMI 22.84 kg/m   Constitutional:   Pleasant Caucasian female appears to be in NAD, Well developed, Well nourished, alert and cooperative  Respiratory: Respirations even and unlabored. Lungs clear to auscultation bilaterally.   No wheezes, crackles, or rhonchi.  Cardiovascular: Normal S1, S2. No MRG. Regular rate and rhythm. No peripheral edema, cyanosis or pallor.  Gastrointestinal:  Soft, nondistended, moderate right lower quadrant pain, moderate epigastric pain No rebound or guarding. Normal bowel sounds. No appreciable masses or hepatomegaly. Psychiatric: Demonstrates good judgement and reason without abnormal affect or behaviors.  RELEVANT LABS AND IMAGING: CBC    Component Value Date/Time   WBC 8.2 09/20/2016 0825   RBC 4.82 09/20/2016 0825   HGB 15.1 (H) 09/20/2016 0825   HCT 43.6 09/20/2016 0825   PLT 245 09/20/2016 0825   MCV 90.5 09/20/2016 0825   MCH 31.3 09/20/2016 0825   MCHC 34.6 09/20/2016 0825   RDW 12.2 09/20/2016 0825    LYMPHSABS 3.0 09/22/2014 1338   MONOABS 0.4 09/22/2014 1338   EOSABS 0.1 09/22/2014 1338   BASOSABS 0.0 09/22/2014 1338    CMP     Component Value Date/Time   NA 137 09/20/2016 0825   K 3.8 09/20/2016 0825   CL 103 09/20/2016 0825   CO2 26 09/20/2016 0825   GLUCOSE 92 09/20/2016 0825   BUN 17 09/20/2016 0825   CREATININE 0.86 09/20/2016 0825   CALCIUM 9.4 09/20/2016 0825   PROT 7.7 09/20/2016 0825   ALBUMIN 4.4 09/20/2016 0825   AST 17 09/20/2016 0825   ALT 17 09/20/2016 0825   ALKPHOS 60 09/20/2016 0825   BILITOT 0.9 09/20/2016 0825   GFRNONAA >60 09/20/2016 0825   GFRAA >60 09/20/2016 0825   EXAM: CT  ABDOMEN AND PELVIS WITH CONTRAST 09/27/16  TECHNIQUE: Multidetector CT imaging of the abdomen and pelvis was performed using the standard protocol following bolus administration of intravenous contrast.  CONTRAST:  171mL ISOVUE-300 IOPAMIDOL (ISOVUE-300) INJECTION 61%  COMPARISON:  09/22/2014  FINDINGS: Lower chest: Lung bases demonstrate no acute consolidation or pleural effusion. Normal heart size.  Hepatobiliary: No focal liver abnormality is seen. Status post cholecystectomy. No biliary dilatation.  Pancreas: Unremarkable. No pancreatic ductal dilatation or surrounding inflammatory changes.  Spleen: Normal in size without focal abnormality.  Adrenals/Urinary Tract: Adrenal glands are unremarkable. Kidneys are normal, without renal calculi, focal lesion, or hydronephrosis. Bladder is unremarkable.  Stomach/Bowel: Stomach is within normal limits. Appendix appears normal. No evidence of bowel wall thickening, distention, or inflammatory changes. Sigmoid colon diverticular disease but no acute wall thickening or inflammation.  Vascular/Lymphatic: No significant vascular findings are present. No enlarged abdominal or pelvic lymph nodes.  Reproductive: Uterus and bilateral adnexa are unremarkable.  Other: No abdominal wall hernia or  abnormality. No abdominopelvic ascites.  Musculoskeletal: No acute or significant osseous findings.  IMPRESSION: 1. No CT evidence for acute intra-abdominal or pelvic pathology 2. Sigmoid colon diverticular disease without acute inflammation   Electronically Signed   By: Donavan Foil M.D.   On: 09/27/2016 18:27  Assessment: 1. Epigastric pain: Continues, regardless of Omeprazole 40 mg twice a day, patient had described this as burning in nature in the past, causing a decrease in appetite; consider H. pylori versus gastritis versus functional dyspepsia versus other 2. GERD: Patient has decreased symptoms, but still continues to describe what sounds like heartburn, see above 3. Nausea: Continues per patient, likely with above 4. Right lower quadrant abdominal pain: Remains on exam today, 2 CTs recently returned negative for etiology, if continues could consider colonoscopy?; Consider IBS versus muscle pain versus ileitis versus other; of note, pt does not seem bothered by this pain until palpation on exam-denies change in urination/dysuria  Plan: 1. Patient continues with epigastric pain and symptoms including reflux and nausea regardless of Omeprazole 40 mg twice a day. Would recommend that we proceed with an EGD for further evaluation at this time. This is scheduled in the [place] with Dr. Silverio Decamp. Discussed risks, benefits, limitations and alternatives the patient agrees to proceed. 2. Patient to continue her Omeprazole 40 mg twice a day, 30-60 minutes before eating breakfast and dinner 3. Patient to continue to abide by the antireflux diet and lifestyle modifications 4. Patient may add back in her Carafate at least twice a day, 1 hour before or 2 hours after eating and other medications 5. Encouraged patient to complete the H. pylori testing as ordered at last visit, if this returns positive we can cancel EGD for now and treat her for this. 6. Patient to follow in clinic per Dr.  Woodward Ku recommendations after time of procedure.  Ellouise Newer, PA-C [facility] 10/09/2016, 1:16 PM  Cc: Lucille Passy, MD

## 2016-10-09 NOTE — Patient Instructions (Signed)
You have been scheduled for an endoscopy. Please follow written instructions given to you at your visit today. If you use inhalers (even only as needed), please bring them with you on the day of your procedure. Your physician has requested that you go to www.startemmi.com and enter the access code given to you at your visit today. This web site gives a general overview about your procedure. However, you should still follow specific instructions given to you by our office regarding your preparation for the procedure.  Please continue omeprazole 40 mg twice daily.  Restart your carafate twice daily (1 hour before or 2 hours after eating and other medications)  Your physician has requested that you go to the basement for the following lab work before leaving today: H Pylori stool antigen  Follow up will be determined after endoscopy and stool testing.  If you are age 88 or older, your body mass index should be between 23-30. Your Body mass index is 22.84 kg/m. If this is out of the aforementioned range listed, please consider follow up with your Primary Care Provider.  If you are age 66 or younger, your body mass index should be between 19-25. Your Body mass index is 22.84 kg/m. If this is out of the aformentioned range listed, please consider follow up with your Primary Care Provider.

## 2016-10-10 ENCOUNTER — Encounter: Payer: Self-pay | Admitting: Gastroenterology

## 2016-10-11 ENCOUNTER — Other Ambulatory Visit: Payer: 59

## 2016-10-11 DIAGNOSIS — R110 Nausea: Secondary | ICD-10-CM

## 2016-10-11 DIAGNOSIS — R1031 Right lower quadrant pain: Secondary | ICD-10-CM | POA: Diagnosis not present

## 2016-10-11 DIAGNOSIS — R1013 Epigastric pain: Secondary | ICD-10-CM

## 2016-10-14 LAB — HELICOBACTER PYLORI  SPECIAL ANTIGEN: H. PYLORI ANTIGEN STOOL: NOT DETECTED

## 2016-10-17 ENCOUNTER — Ambulatory Visit (AMBULATORY_SURGERY_CENTER): Payer: 59 | Admitting: Gastroenterology

## 2016-10-17 ENCOUNTER — Encounter: Payer: Self-pay | Admitting: Gastroenterology

## 2016-10-17 VITALS — BP 100/69 | HR 74 | Temp 97.7°F | Resp 12 | Ht 63.5 in | Wt 131.0 lb

## 2016-10-17 DIAGNOSIS — R1013 Epigastric pain: Secondary | ICD-10-CM | POA: Diagnosis not present

## 2016-10-17 DIAGNOSIS — G8929 Other chronic pain: Secondary | ICD-10-CM

## 2016-10-17 DIAGNOSIS — K219 Gastro-esophageal reflux disease without esophagitis: Secondary | ICD-10-CM

## 2016-10-17 DIAGNOSIS — I10 Essential (primary) hypertension: Secondary | ICD-10-CM | POA: Diagnosis not present

## 2016-10-17 MED ORDER — SODIUM CHLORIDE 0.9 % IV SOLN: 500.0000 mL | INTRAVENOUS | Status: DC

## 2016-10-17 NOTE — Progress Notes (Signed)
Called to room to assist during endoscopic procedure.  Patient ID and intended procedure confirmed with present staff. Received instructions for my participation in the procedure from the performing physician.  

## 2016-10-17 NOTE — Op Note (Signed)
[facility] Patient Name: [name] Procedure Date: 10/17/2016 9:41 AM MRN: [id number] Endoscopist: Mauri Pole , MD Age: 36 Referring MD:  Date of Birth: Sep 30, 1980 Gender: Female Account #: [id number] Procedure:                Upper GI endoscopy Indications:              Upper abdominal symptoms that persist despite an                            appropriate trial of therapy, Epigastric abdominal                            pain, Dyspepsia Medicines:                Monitored Anesthesia Care Procedure:                Pre-Anesthesia Assessment:                           - Prior to the procedure, a History and Physical                            was performed, and patient medications and                            allergies were reviewed. The patient's tolerance of                            previous anesthesia was also reviewed. The risks                            and benefits of the procedure and the sedation                            options and risks were discussed with the patient.                            All questions were answered, and informed consent                            was obtained. Prior Anticoagulants: The patient has                            taken no previous anticoagulant or antiplatelet                            agents. ASA Grade Assessment: II - A patient with                            mild systemic disease. After reviewing the risks                            and benefits, the patient was deemed in  satisfactory condition to undergo the procedure.                           After obtaining informed consent, the endoscope was                            passed under direct vision. Throughout the                            procedure, the patient's blood pressure, pulse, and                            oxygen saturations were monitored continuously. The                            Model GIF-[phone]) scope  was introduced                            through the mouth, and advanced to the second part                            of duodenum. The upper GI endoscopy was                            accomplished without difficulty. The patient                            tolerated the procedure well. Scope In: Scope Out: Findings:                 The esophagus was normal.                           The stomach was normal.                           Biopsies were taken with a cold forceps in the                            gastric body and in the gastric antrum for                            Helicobacter pylori testing using CLOtest.                           The examined duodenum was normal. Complications:            No immediate complications. Estimated Blood Loss:     Estimated blood loss was minimal. Impression:               - Normal esophagus.                           - Normal stomach.                           - Normal examined duodenum.                           -  Biopsies were taken with a cold forceps for                            Helicobacter pylori testing using CLOtest. Recommendation:           - Patient has a contact number available for                            emergencies. The signs and symptoms of potential                            delayed complications were discussed with the                            patient. Return to normal activities tomorrow.                            Written discharge instructions were provided to the                            patient.                           - Resume previous diet.                           - Continue present medications.                           - Await Clotest results.                           - No repeat upper endoscopy.                           - Return to GI office PRN. Mauri Pole, MD 10/17/2016 9:55:26 AM This report has been signed electronically.

## 2016-10-17 NOTE — Progress Notes (Signed)
Report to PACU, RN, vss, BBS= Clear.  

## 2016-10-17 NOTE — Patient Instructions (Signed)
YOU HAD AN ENDOSCOPIC PROCEDURE TODAY AT THE [facility]:   Refer to the procedure report that was given to you for any specific questions about what was found during the examination.  If the procedure report does not answer your questions, please call your gastroenterologist to clarify.  If you requested that your care partner not be given the details of your procedure findings, then the procedure report has been included in a sealed envelope for you to review at your convenience later.  YOU SHOULD EXPECT: Some feelings of bloating in the abdomen. Passage of more gas than usual.  Walking can help get rid of the air that was put into your GI tract during the procedure and reduce the bloating. If you had a lower endoscopy (such as a colonoscopy or flexible sigmoidoscopy) you may notice spotting of blood in your stool or on the toilet paper. If you underwent a bowel prep for your procedure, you may not have a normal bowel movement for a few days.  Please Note:  You might notice some irritation and congestion in your nose or some drainage.  This is from the oxygen used during your procedure.  There is no need for concern and it should clear up in a day or so.  SYMPTOMS TO REPORT IMMEDIATELY:    Following upper endoscopy (EGD)  Vomiting of blood or coffee ground material  New chest pain or pain under the shoulder blades  Painful or persistently difficult swallowing  New shortness of breath  Fever of 100F or higher  Black, tarry-looking stools  For urgent or emergent issues, a gastroenterologist can be reached at any hour by calling [phone].   DIET:  We do recommend a small meal at first, but then you may proceed to your regular diet.  Drink plenty of fluids but you should avoid alcoholic beverages for 24 hours.  ACTIVITY:  You should plan to take it easy for the rest of today and you should NOT DRIVE or use heavy machinery until tomorrow (because of the sedation medicines used  during the test).    FOLLOW UP: Our staff will call the number listed on your records the next business day following your procedure to check on you and address any questions or concerns that you may have regarding the information given to you following your procedure. If we do not reach you, we will leave a message.  However, if you are feeling well and you are not experiencing any problems, there is no need to return our call.  We will assume that you have returned to your regular daily activities without incident.  If any biopsies were taken you will be contacted by phone or by letter within the next 1-3 weeks.  Please call us at [phone] if you have not heard about the biopsies in 3 weeks.    SIGNATURES/CONFIDENTIALITY: You and/or your care partner have signed paperwork which will be entered into your electronic medical record.  These signatures attest to the fact that that the information above on your After Visit Summary has been reviewed and is understood.  Full responsibility of the confidentiality of this discharge information lies with you and/or your care-partner.   Await biopsy results. 

## 2016-10-18 ENCOUNTER — Telehealth: Payer: Self-pay

## 2016-10-18 ENCOUNTER — Other Ambulatory Visit (INDEPENDENT_AMBULATORY_CARE_PROVIDER_SITE_OTHER): Payer: 59

## 2016-10-18 DIAGNOSIS — K219 Gastro-esophageal reflux disease without esophagitis: Secondary | ICD-10-CM

## 2016-10-18 DIAGNOSIS — G8929 Other chronic pain: Secondary | ICD-10-CM

## 2016-10-18 DIAGNOSIS — R1013 Epigastric pain: Secondary | ICD-10-CM | POA: Diagnosis not present

## 2016-10-18 LAB — HELICOBACTER PYLORI SCREEN-BIOPSY: UREASE: NEGATIVE

## 2016-10-18 NOTE — Telephone Encounter (Signed)
  Follow up Call-  Call back number 10/17/2016  Post procedure Call Back phone  # [phone]  Permission to leave phone message Yes  Some recent data might be hidden     Patient questions:  Do you have a fever, pain , or abdominal swelling? No. Pain Score  0 *  Have you tolerated food without any problems? Yes.    Have you been able to return to your normal activities? Yes.    Do you have any questions about your discharge instructions: Diet   No. Medications  No. Follow up visit  No.  Do you have questions or concerns about your Care? No.  Actions: * If pain score is 4 or above: No action needed, pain <4.

## 2016-10-21 NOTE — Progress Notes (Signed)
Reviewed and agree with documentation and assessment and plan. K. Veena Casper Pagliuca , MD   

## 2016-11-06 MED FILL — SPIRONOLACTONE 50 MG TABLET: 50 | 30 days supply | Qty: 60 | Fill #1

## 2016-12-12 MED FILL — SPIRONOLACTONE 50 MG TABLET: 50 | 30 days supply | Qty: 60 | Fill #2

## 2016-12-20 ENCOUNTER — Encounter: Payer: Self-pay | Admitting: Family Medicine

## 2016-12-20 ENCOUNTER — Other Ambulatory Visit: Payer: Self-pay | Admitting: Family Medicine

## 2016-12-20 ENCOUNTER — Other Ambulatory Visit (HOSPITAL_COMMUNITY): Payer: Self-pay | Admitting: *Deleted

## 2016-12-20 MED ORDER — TRIAMCINOLONE ACETONIDE 0.1 % EX CREA: 1.0000 "application " | TOPICAL_CREAM | Freq: Two times a day (BID) | CUTANEOUS | 0 refills | Status: DC

## 2016-12-20 MED ORDER — PREDNISONE 5 MG PO TABS: 5.0000 mg | ORAL_TABLET | Freq: Two times a day (BID) | ORAL | 0 refills | Status: DC

## 2016-12-20 MED FILL — TRIAMCINOLONE 0.1% CREAM: 0.1 | 15 days supply | Qty: 30 | Fill #0

## 2016-12-20 MED FILL — predniSONE 5 MG TABS: 5 | 3 days supply | Qty: 6 | Fill #0

## 2017-01-14 HISTORY — PX: INTRAUTERINE DEVICE INSERTION: SHX323

## 2017-01-15 MED FILL — SPIRONOLACTONE 50 MG TABLET: 50 | 30 days supply | Qty: 60 | Fill #3

## 2017-02-03 DIAGNOSIS — Z30430 Encounter for insertion of intrauterine contraceptive device: Secondary | ICD-10-CM | POA: Diagnosis not present

## 2017-02-24 MED FILL — SPIRONOLACTONE 50 MG TAB: 50 | 30 days supply | Qty: 60 | Fill #4

## 2017-03-03 DIAGNOSIS — N939 Abnormal uterine and vaginal bleeding, unspecified: Secondary | ICD-10-CM | POA: Diagnosis not present

## 2017-03-24 MED FILL — SPIRONOLACTONE 50 MG TAB: 50 | 30 days supply | Qty: 60 | Fill #5

## 2017-03-24 MED FILL — OMEPRAZOLE DR 40 MG CAPSULE: 40 | 30 days supply | Qty: 60 | Fill #1

## 2017-04-02 DIAGNOSIS — Z01419 Encounter for gynecological examination (general) (routine) without abnormal findings: Secondary | ICD-10-CM | POA: Diagnosis not present

## 2017-04-02 DIAGNOSIS — L709 Acne, unspecified: Secondary | ICD-10-CM | POA: Diagnosis not present

## 2017-04-02 DIAGNOSIS — Z6824 Body mass index (BMI) 24.0-24.9, adult: Secondary | ICD-10-CM | POA: Diagnosis not present

## 2017-04-23 ENCOUNTER — Other Ambulatory Visit (HOSPITAL_COMMUNITY): Payer: Self-pay | Admitting: Cardiology

## 2017-04-23 MED ORDER — FLUCONAZOLE 150 MG PO TABS: 150.0000 mg | ORAL_TABLET | Freq: Every day | ORAL | 0 refills | Status: DC

## 2017-04-23 MED FILL — FLUCONAZOLE 150 MG TABLET: 150 | 2 days supply | Qty: 2 | Fill #0

## 2017-04-28 IMAGING — CT CT ABD-PELV W/ CM
2 of 3 series · 16 of 46 positions shown, 18 images · IV contrast (iopamidol)
Comparison: 09/22/2014

CLINICAL DATA: Right lower quadrant pain and nausea

EXAM:
CT ABDOMEN AND PELVIS WITH CONTRAST
TECHNIQUE: Multidetector CT imaging of the abdomen and pelvis was performed
using the standard protocol following bolus administration of
intravenous contrast.
CONTRAST:  100mL XSPB54-JHH IOPAMIDOL (XSPB54-JHH) INJECTION 61%

[Series 2: abd/pel with · axial · 0.74mm/px · z∈[-782,-372]mm · 13 of 96 slices shown, 15 images]
[im 7/96  soft-tissue]
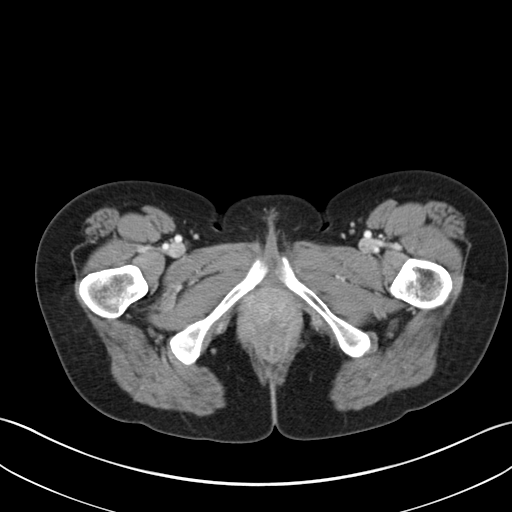
[im 7/96  bone]
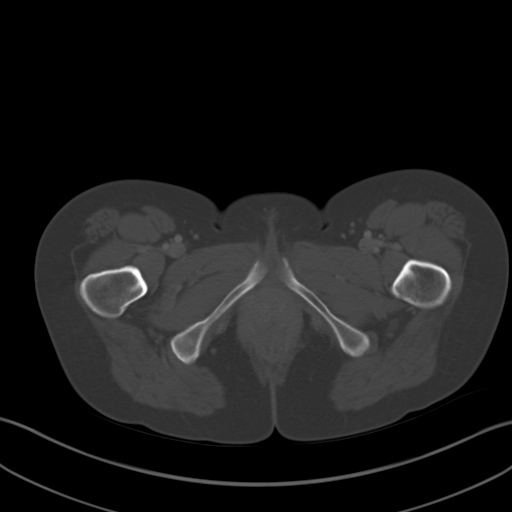
[im 13/96  soft-tissue]
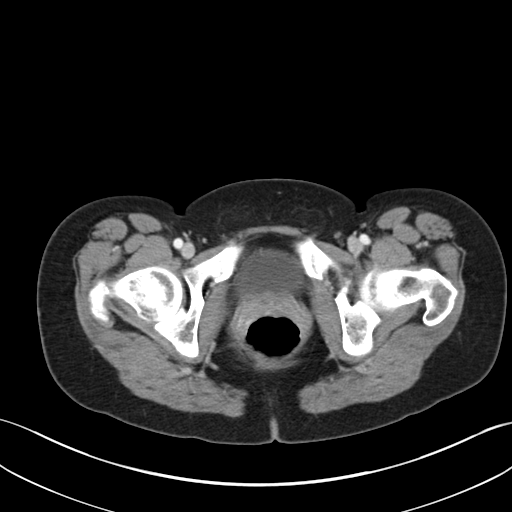
[im 19/96  soft-tissue]
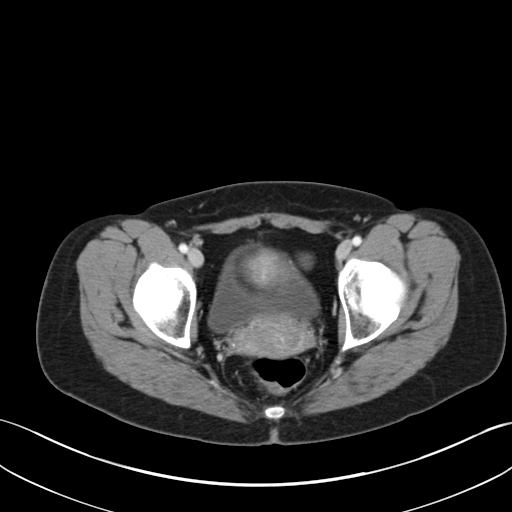
[im 28/96  soft-tissue]
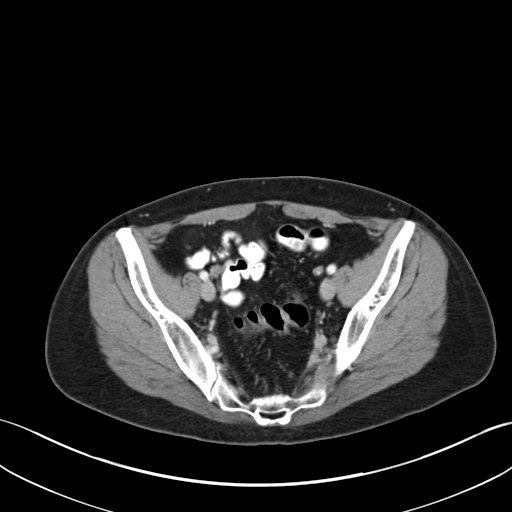
[im 34/96  soft-tissue]
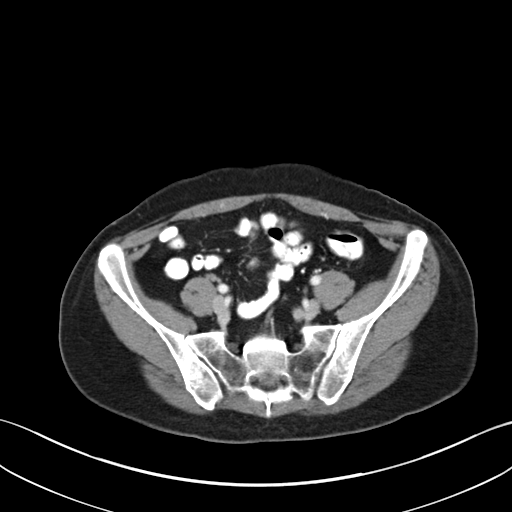
[im 40/96  soft-tissue]
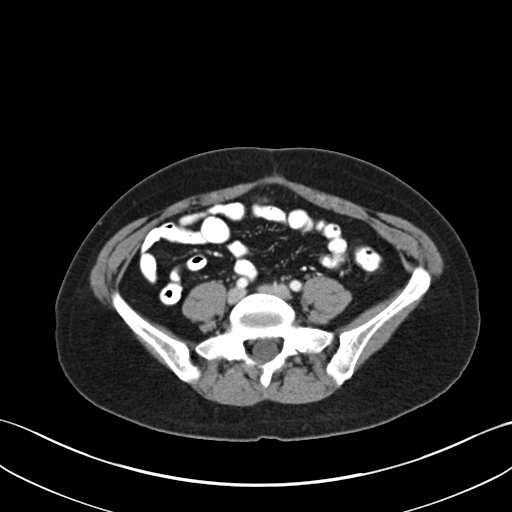
[im 50/96  soft-tissue]
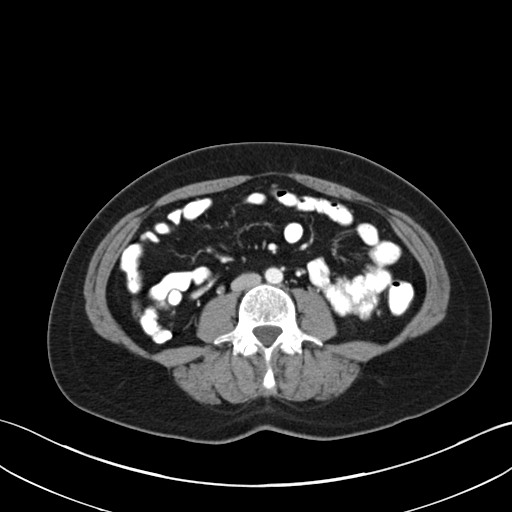
[im 56/96  soft-tissue]
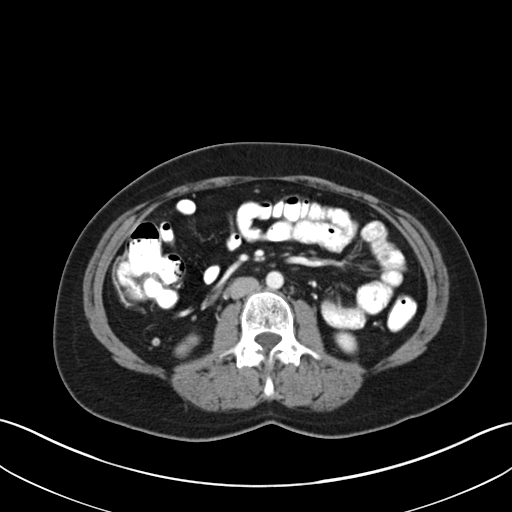
[im 62/96  soft-tissue]
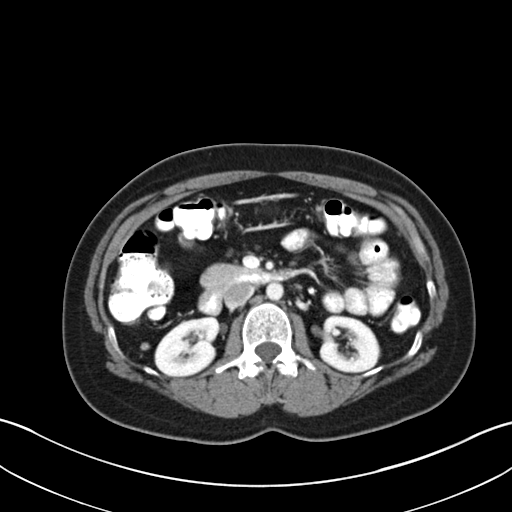
[im 62/96  bone]
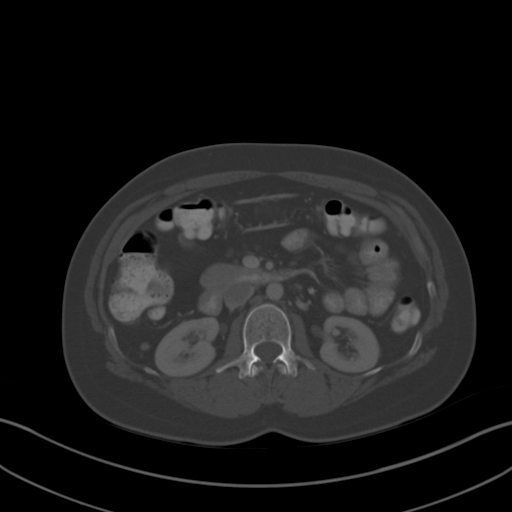
[im 68/96  soft-tissue]
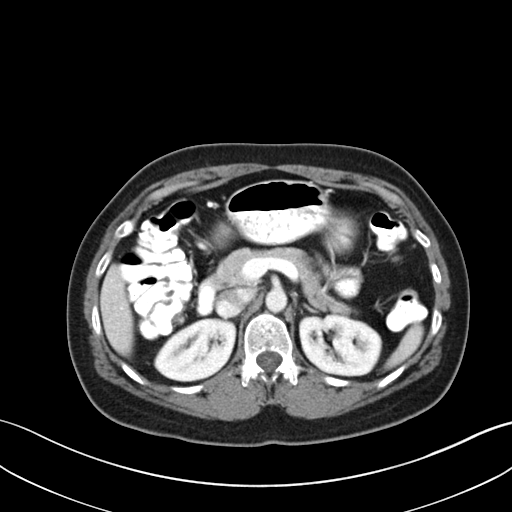
[im 77/96  soft-tissue]
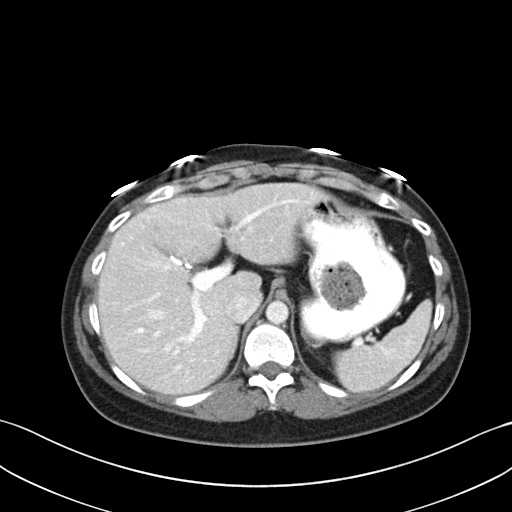
[im 83/96  soft-tissue]
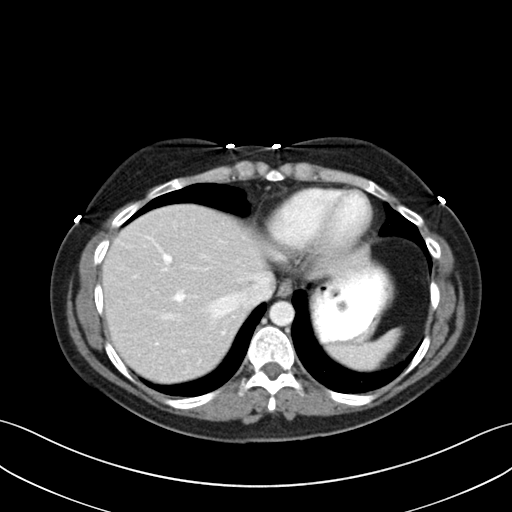
[im 89/96  soft-tissue]
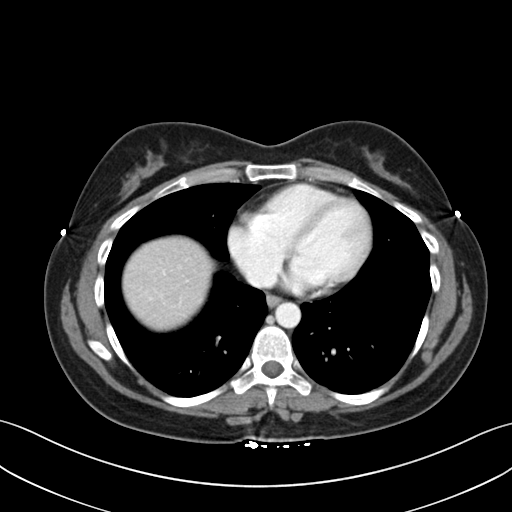

[Series 3: coronal a/|p · coronal · 0.74mm/px · 3 of 136 slices shown]
[im 46/136  soft-tissue]
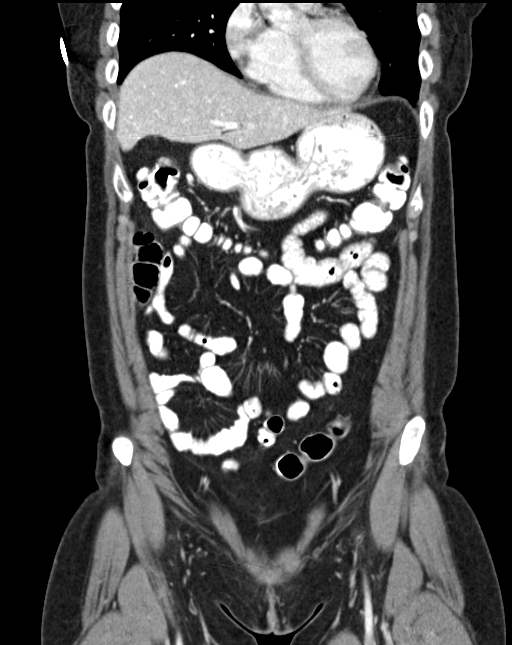
[im 61/136  soft-tissue]
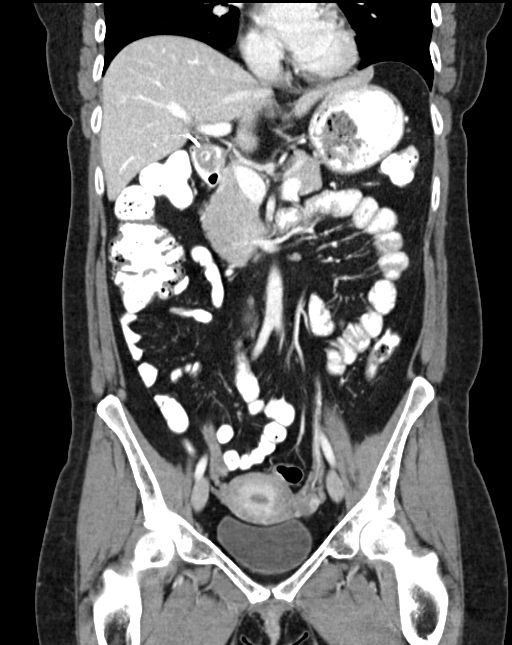
[im 76/136  soft-tissue]
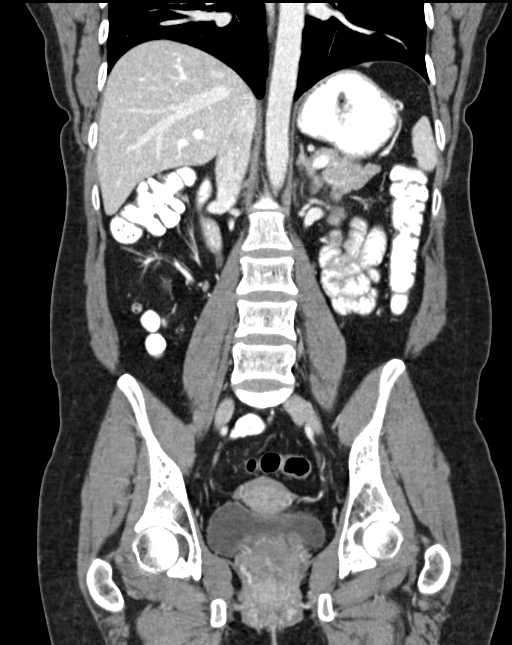

[16 of 46 positions shown; findings below may reference images not displayed]

FINDINGS: Lower chest: Lung bases demonstrate no acute consolidation or
pleural effusion. Normal heart size.

Hepatobiliary: No focal liver abnormality is seen. Status post
cholecystectomy. No biliary dilatation.

Pancreas: Unremarkable. No pancreatic ductal dilatation or
surrounding inflammatory changes.

Spleen: Normal in size without focal abnormality.

Adrenals/Urinary Tract: Adrenal glands are unremarkable. Kidneys are
normal, without renal calculi, focal lesion, or hydronephrosis.
Bladder is unremarkable.

Stomach/Bowel: Stomach is within normal limits. Appendix appears
normal. No evidence of bowel wall thickening, distention, or
inflammatory changes. Sigmoid colon diverticular disease but no
acute wall thickening or inflammation.

Vascular/Lymphatic: No significant vascular findings are present. No
enlarged abdominal or pelvic lymph nodes.

Reproductive: Uterus and bilateral adnexa are unremarkable.

Other: No abdominal wall hernia or abnormality. No abdominopelvic
ascites.

Musculoskeletal: No acute or significant osseous findings.
IMPRESSION: 1. No CT evidence for acute intra-abdominal or pelvic pathology
2. Sigmoid colon diverticular disease without acute inflammation

## 2017-05-07 MED FILL — SPIRONOLACTONE 50 MG TAB: 50 | 30 days supply | Qty: 60 | Fill #0

## 2017-05-08 ENCOUNTER — Telehealth: Payer: Self-pay | Admitting: Family Medicine

## 2017-05-08 NOTE — Telephone Encounter (Signed)
Patient Name: TANISIA YOKLEY  DOB: July 17, 1981    Initial Comment caller needs an appointment . Her right arm is hurting and tingling . 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    Nurse Assessment  Nurse: Raphael Gibney, RN, Vanita Ingles Date/Time (Eastern Time): 05/08/2017 10:19:52 AM  Confirm and document reason for call. If symptomatic, describe symptoms. ---Caller states she has intermittent neck pain. Has intermittent right arm pain with tingling. Her right hand goes numb at times. No symptoms now. Has had symptoms for 2 weeks. Ibuprofen helps  Does the patient have any new or worsening symptoms? ---Yes  Will a triage be completed? ---Yes  Related visit to physician within the last 2 weeks? ---No  Does the PT have any chronic conditions? (i.e. diabetes, asthma, etc.) ---No  Is the patient pregnant or possibly pregnant? (Ask all females between the ages of 97-55) ---No  Is this a behavioral health or substance abuse call? ---No     Guidelines    Guideline Title Affirmed Question Affirmed Notes  Neck Pain or Stiffness Numbness in an arm or hand (i.e., loss of sensation)    Final Disposition User   See Physician within 24 Hours [place], RN, Vanita Ingles    Comments  appt scheduled for 05/09/17 at 10:30 am with Dr. Damita Dunnings   Referrals  REFERRED TO PCP OFFICE   Disagree/Comply: Comply

## 2017-05-08 NOTE — Telephone Encounter (Signed)
Pt has appt with Dr Damita Dunnings 05/09/17 at 10;30.

## 2017-05-09 ENCOUNTER — Ambulatory Visit (INDEPENDENT_AMBULATORY_CARE_PROVIDER_SITE_OTHER): Payer: 59 | Admitting: Family Medicine

## 2017-05-09 ENCOUNTER — Encounter: Payer: Self-pay | Admitting: Family Medicine

## 2017-05-09 DIAGNOSIS — R202 Paresthesia of skin: Secondary | ICD-10-CM

## 2017-05-09 NOTE — Progress Notes (Signed)
Not lightheaded.  BP is typical for patient, similar to prev.    2 weeks of sx.  R hand would be numb at night.  Now with upper arm and neck pain.  No L sided pain or numbness, no leg sx. R grip is still normal.  R handed.  No injury, no trauma.    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  Frequent computer use at at baseline.    No FCNAV.  Ibuprofen helps some.    Meds, vitals, and allergies reviewed.   ROS: Per HPI unless specifically indicated in ROS section   nad ncat Neck supple, no LA, normal ROM, no radicular sx with neck ROM.   rrr ctab Normal ROM R shoulder w/o pain on internal or external rotation. Supraspinatus testing negative. Normal range of motion at the right elbow. Tinel testing at R wrist caused sensation changes in the upper arm.  Dec mono filament testing R hand.  Normal strength and reflexes BUE o/w.   No rash.  No bruising.

## 2017-05-09 NOTE — Patient Instructions (Signed)
Try a regular wrist splint- sleep in it.   Check your work station and see if you can make an adjustment.  Update [place] if not better.  Take care.  Glad to see you.

## 2017-05-11 DIAGNOSIS — R202 Paresthesia of skin: Secondary | ICD-10-CM | POA: Insufficient documentation

## 2017-05-11 NOTE — Assessment & Plan Note (Signed)
With arm pain. Tinel testing at the right wrist was positive and caused pain in the upper arm. Possible that she could have carpal tunnel symptoms radiating proximally. Advised patient to try a nighttime wrist splint and see if that makes a difference in her symptoms. Her strength is still normal. She does not have an obvious source at her neck, since she has normal range of motion at the neck. Anatomy discussed with patient. She agrees. Update [place] as needed.

## 2017-06-26 MED FILL — SPIRONOLACTONE 50 MG TAB: 50 | 30 days supply | Qty: 60 | Fill #1

## 2017-07-04 ENCOUNTER — Ambulatory Visit (INDEPENDENT_AMBULATORY_CARE_PROVIDER_SITE_OTHER): Payer: 59 | Admitting: Podiatry

## 2017-07-04 ENCOUNTER — Encounter: Payer: Self-pay | Admitting: Podiatry

## 2017-07-04 VITALS — BP 102/63 | HR 76 | Resp 16

## 2017-07-04 DIAGNOSIS — B070 Plantar wart: Secondary | ICD-10-CM | POA: Diagnosis not present

## 2017-07-04 NOTE — Progress Notes (Signed)
Subjective:  Patient ID: [name], female    DOB: [birth year],  MRN: [id number]  Chief Complaint  Patient presents with  . Toe Pain    Plantar hallux left - callused lesion x several weeks, gotten bigger recently, tried OTC wart meds, tender   36 y.o. female presents with the above complaint. Has painful lesion to the bottom of left great toe. Has tried over-the-counter wart medication without relief.  Past Medical History:  Diagnosis Date  . Allergic rhinitis   . Allergy   . GERD (gastroesophageal reflux disease)   . Gestational diabetes    Past Surgical History:  Procedure Laterality Date  . CESAREAN SECTION  06, 09   x2  . CHOLECYSTECTOMY  11/22/2011   Procedure: LAPAROSCOPIC CHOLECYSTECTOMY;  Surgeon: Rolm Bookbinder, MD;  Location: [facility];  Service: General;  Laterality: N/A;    Current Outpatient Prescriptions:  .  cetirizine (ZYRTEC) 10 MG tablet, Take 10 mg by mouth daily., Disp: , Rfl:  .  cyanocobalamin 2000 MCG tablet, Take 2,000 mcg by mouth daily., Disp: , Rfl:  .  cyclobenzaprine (FLEXERIL) 5 MG tablet, Take 1 tablet (5 mg total) by mouth 3 (three) times daily as needed for muscle spasms., Disp: 30 tablet, Rfl: 1 .  ibuprofen (ADVIL,MOTRIN) 200 MG tablet, Take 3 tablets (600 mg total) by mouth every 8 (eight) hours as needed (with food)., Disp: , Rfl:  .  omeprazole (PRILOSEC) 40 MG capsule, Take one capsule by mouth 30-60 minutes before breakfast and dinner (Patient taking differently: Take one capsule by mouth 30-60 minutes before breakfast and dinner as needed.), Disp: 60 capsule, Rfl: 2 .  Probiotic Product (PROBIOTIC PO), Take by mouth., Disp: , Rfl:  .  spironolactone (ALDACTONE) 50 MG tablet, Take 50 mg by mouth 2 (two) times daily., Disp: , Rfl: 5 .  triamcinolone (KENALOG) 0.147 MG/GM topical spray, APPLY TOPICALLY 2 TIMES DAILY., Disp: 63 g, Rfl: 0 .  triamcinolone cream (KENALOG) 0.1 %, Apply 1 application topically 2 (two) times daily., Disp: 30 g,  Rfl: 0  Current Facility-Administered Medications:  .  0.9 %  sodium chloride infusion, 500 mL, Intravenous, Continuous, Nandigam, Venia Minks, MD  Allergies  Allergen Reactions  . Codeine Nausea And Vomiting  . Gabapentin     Sedation at low doses  . Hydrocodone Nausea And Vomiting  . Morphine And Related Hives and Itching   Review of Systems  All other systems reviewed and are negative.  Objective:   Vitals:   07/04/17 0830  BP: 102/63  Pulse: 76  Resp: 16   General AA&O x3. Normal mood and affect.  Vascular Dorsalis pedis and posterior tibial pulses  present 2+ bilaterally  Capillary refill normal to all digits. Pedal hair growth normal.  Neurologic Epicritic sensation grossly present.  Dermatologic Plantar left hallux hyperkeratotic lesion with pain upon lateral rather than direct compression and petechial bleeding upon debridement Interspaces clear of maceration. Nails well groomed and normal in appearance.  Orthopedic: MMT 5/5 in dorsiflexion, plantarflexion, inversion, and eversion. Normal joint ROM without pain or crepitus.   Assessment & Plan:  Patient was evaluated and treated and all questions answered.  Verruca, L great toe. -Educated on the etiology. -Lesion destroyed as below. -Educated on post-op care.   Procedure: Destruction of Lesion Location: L hallux Anesthesia: none Instrumentation: 15 blade. Technique: Debridement of lesion to petechial bleeding. Aperture pad applied around lesion. Small amount of canthrone applied to the base of the lesion. Dressing: Dry, sterile, compression  dressing. Disposition: Patient tolerated procedure well. Advised to leave dressing on for 6-8 hours. Thereafter patient to wash the area with soap and water and applied band-aid. Off-loading pads dispensed. Patient to return in 2 weeks for follow-up.

## 2017-07-16 ENCOUNTER — Emergency Department (HOSPITAL_COMMUNITY)
Admission: EM | Admit: 2017-07-16 | Discharge: 2017-07-17 | Disposition: A | Discharge status: Home / Self Care | Payer: No Typology Code available for payment source | Attending: Pediatric Emergency Medicine | Admitting: Pediatric Emergency Medicine

## 2017-07-16 ENCOUNTER — Emergency Department (HOSPITAL_COMMUNITY): Payer: No Typology Code available for payment source

## 2017-07-16 ENCOUNTER — Encounter (HOSPITAL_COMMUNITY): Payer: Self-pay | Admitting: Emergency Medicine

## 2017-07-16 DIAGNOSIS — S59902A Unspecified injury of left elbow, initial encounter: Secondary | ICD-10-CM | POA: Diagnosis not present

## 2017-07-16 DIAGNOSIS — T148XXA Other injury of unspecified body region, initial encounter: Secondary | ICD-10-CM | POA: Diagnosis not present

## 2017-07-16 DIAGNOSIS — M79622 Pain in left upper arm: Secondary | ICD-10-CM | POA: Diagnosis not present

## 2017-07-16 DIAGNOSIS — S4992XA Unspecified injury of left shoulder and upper arm, initial encounter: Secondary | ICD-10-CM | POA: Diagnosis not present

## 2017-07-16 DIAGNOSIS — M79602 Pain in left arm: Secondary | ICD-10-CM | POA: Diagnosis not present

## 2017-07-16 DIAGNOSIS — M25512 Pain in left shoulder: Secondary | ICD-10-CM | POA: Diagnosis not present

## 2017-07-16 DIAGNOSIS — Z79899 Other long term (current) drug therapy: Secondary | ICD-10-CM | POA: Insufficient documentation

## 2017-07-16 DIAGNOSIS — M25522 Pain in left elbow: Secondary | ICD-10-CM | POA: Diagnosis not present

## 2017-07-16 MED ORDER — IBUPROFEN 400 MG PO TABS
600.0000 mg | ORAL_TABLET | Freq: Once | ORAL | Status: AC
  Administered 2017-07-16: 600 mg via ORAL
  Filled 2017-07-16: qty 1

## 2017-07-16 MED ORDER — CYCLOBENZAPRINE HCL 10 MG PO TABS: 10.0000 mg | ORAL_TABLET | Freq: Every evening | ORAL | 0 refills | Status: DC | PRN

## 2017-07-16 MED ORDER — CYCLOBENZAPRINE HCL 10 MG PO TABS
10.0000 mg | ORAL_TABLET | Freq: Once | ORAL | Status: AC
  Administered 2017-07-17: 10 mg via ORAL
  Filled 2017-07-16: qty 1
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
## 2017-07-16 NOTE — ED Notes (Signed)
Mom is very anxious at this time.
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
## 2017-07-17 DIAGNOSIS — Z79899 Other long term (current) drug therapy: Secondary | ICD-10-CM | POA: Diagnosis not present

## 2017-07-17 DIAGNOSIS — M79622 Pain in left upper arm: Secondary | ICD-10-CM | POA: Diagnosis not present

## 2017-07-17 MED FILL — CYCLOBENZAPRINE 10 MG TAB: 10 | 10 days supply | Qty: 10 | Fill #0

## 2017-07-23 ENCOUNTER — Ambulatory Visit: Payer: 59 | Admitting: Podiatry

## 2017-07-23 ENCOUNTER — Encounter: Payer: Self-pay | Admitting: Podiatry

## 2017-07-23 DIAGNOSIS — B070 Plantar wart: Secondary | ICD-10-CM | POA: Diagnosis not present

## 2017-07-23 NOTE — Progress Notes (Signed)
  Subjective:  Patient ID: [name], female    DOB: 1980/11/24,  MRN: [id number]  Chief Complaint  Patient presents with  . Plantar Warts    i am doing alot better on my right foot   36 y.o. female returns for the above complaint. States the wart is doing a lot better. No longer hurting.  Objective:  There were no vitals filed for this visit. General AA&O x3. Normal mood and affect.  Vascular Pedal pulses palpable.  Neurologic Epicritic sensation grossly intact.  Dermatologic L great toe plantar verrucous lesion with petechial bleeding upon debridement.  Orthopedic: Slight pain to palpation L great toe about the lesion.      Assessment & Plan:  Patient was evaluated and treated and all questions answered.  Verruca -Improvement noted. -Repeat destruction as below.  Procedure: Destruction of Lesion Location: L great toe Anesthesia: none Instrumentation: 15 blade. Technique: Debridement of lesion to petechial bleeding. Aperture pad applied around lesion. Small amount of canthrone applied to the base of the lesion. Dressing: Dry, sterile, compression dressing. Disposition: Patient tolerated procedure well. Advised to leave dressing on for 6-8 hours. Thereafter patient to wash the area with soap and water and applied band-aid. Off-loading pads dispensed. Patient to return in 2 weeks for follow-up.   Return if symptoms worsen or fail to improve.

## 2017-07-28 ENCOUNTER — Encounter: Payer: Self-pay | Admitting: Family Medicine

## 2017-07-28 ENCOUNTER — Ambulatory Visit (INDEPENDENT_AMBULATORY_CARE_PROVIDER_SITE_OTHER): Payer: 59 | Admitting: Family Medicine

## 2017-07-28 DIAGNOSIS — M79622 Pain in left upper arm: Secondary | ICD-10-CM | POA: Diagnosis not present
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
## 2017-08-05 MED FILL — FLUCONAZOLE 150 MG TABLET: 150 | 3 days supply | Qty: 2 | Fill #0

## 2017-08-21 ENCOUNTER — Other Ambulatory Visit: Payer: Self-pay | Admitting: Family Medicine

## 2017-08-22 MED FILL — TRIAMCINOLONE ACETONIDE 0.1: 0.147 | 25 days supply | Qty: 63 | Fill #0

## 2017-08-29 MED FILL — SPIRONOLACTONE 50 MG TAB: 50 | 30 days supply | Qty: 60 | Fill #2

## 2017-09-15 DIAGNOSIS — H5213 Myopia, bilateral: Secondary | ICD-10-CM | POA: Diagnosis not present

## 2017-09-15 DIAGNOSIS — H52223 Regular astigmatism, bilateral: Secondary | ICD-10-CM | POA: Diagnosis not present

## 2017-10-06 MED FILL — SPIRONOLACTONE 50 MG TAB: 50 | 30 days supply | Qty: 60 | Fill #3

## 2017-11-11 MED FILL — GATIFLOXACIN 0.5% EYE DROPS: 0.5 | 8 days supply | Qty: 3 | Fill #0

## 2017-11-12 MED FILL — SPIRONOLACTONE 50 MG TABLET: 50 | 30 days supply | Qty: 60 | Fill #4

## 2017-11-13 MED FILL — DUREZOL 0.05% EYE DROPS: 0.05 | 17 days supply | Qty: 5 | Fill #0

## 2017-11-14 HISTORY — PX: REFRACTIVE SURGERY: SHX103

## 2017-11-14 MED FILL — FLUCONAZOLE 150 MG TABLET: 150 | 4 days supply | Qty: 2 | Fill #0

## 2017-12-01 ENCOUNTER — Other Ambulatory Visit: Payer: Self-pay | Admitting: Family Medicine

## 2017-12-02 MED ORDER — TRIAMCINOLONE ACETONIDE 0.147 MG/GM EX AERS: INHALATION_SPRAY | CUTANEOUS | 0 refills | Status: DC

## 2017-12-02 MED FILL — TRIAMCINOLONE ACETONIDE 0.1: 0.147 | 30 days supply | Qty: 63 | Fill #0

## 2017-12-02 MED FILL — POLYMYXIN B/TMP EYE DROPS: 10000-0.1 | 25 days supply | Qty: 10 | Fill #0

## 2017-12-02 NOTE — Telephone Encounter (Signed)
TA-Is this refill req appropriate? Plz advise/thx dmf

## 2017-12-04 MED FILL — ZOLPIDEM TARTRATE 10 MG TAB: 10 | 5 days supply | Qty: 5 | Fill #0

## 2017-12-23 MED FILL — SPIRONOLACTONE 50 MG TAB: 50 | 30 days supply | Qty: 60 | Fill #5

## 2018-02-04 MED FILL — SPIRONOLACTONE 50 MG TAB: 50 | 30 days supply | Qty: 60 | Fill #0

## 2018-02-05 ENCOUNTER — Encounter: Payer: Self-pay | Admitting: Family Medicine

## 2018-02-05 ENCOUNTER — Ambulatory Visit (INDEPENDENT_AMBULATORY_CARE_PROVIDER_SITE_OTHER): Payer: No Typology Code available for payment source | Admitting: Family Medicine

## 2018-02-05 VITALS — BP 106/62 | HR 93 | Temp 97.6°F | Ht 63.0 in | Wt 141.2 lb

## 2018-02-05 DIAGNOSIS — Z793 Long term (current) use of hormonal contraceptives: Secondary | ICD-10-CM | POA: Diagnosis not present

## 2018-02-05 DIAGNOSIS — Z13220 Encounter for screening for lipoid disorders: Secondary | ICD-10-CM | POA: Diagnosis not present

## 2018-02-05 DIAGNOSIS — Z0000 Encounter for general adult medical examination without abnormal findings: Secondary | ICD-10-CM | POA: Diagnosis not present

## 2018-02-05 LAB — CBC WITH DIFFERENTIAL/PLATELET
BASOS ABS: 0 10*3/uL (ref 0.0–0.1)
Basophils Relative: 0.5 % (ref 0.0–3.0)
Eosinophils Absolute: 0 10*3/uL (ref 0.0–0.7)
Eosinophils Relative: 0.5 % (ref 0.0–5.0)
HCT: 42.2 % (ref 36.0–46.0)
Hemoglobin: 14.6 g/dL (ref 12.0–15.0)
Lymphocytes Relative: 23.5 % (ref 12.0–46.0)
Lymphs Abs: 1.8 10*3/uL (ref 0.7–4.0)
MCHC: 34.6 g/dL (ref 30.0–36.0)
MCV: 91.3 fl (ref 78.0–100.0)
MONO ABS: 0.4 10*3/uL (ref 0.1–1.0)
Monocytes Relative: 5.1 % (ref 3.0–12.0)
Neutro Abs: 5.4 10*3/uL (ref 1.4–7.7)
Neutrophils Relative %: 70.4 % (ref 43.0–77.0)
Platelets: 228 10*3/uL (ref 150.0–400.0)
RBC: 4.62 Mil/uL (ref 3.87–5.11)
RDW: 12.8 % (ref 11.5–15.5)
WBC: 7.6 10*3/uL (ref 4.0–10.5)

## 2018-02-05 LAB — COMPREHENSIVE METABOLIC PANEL
ALBUMIN: 4.5 g/dL (ref 3.5–5.2)
ALT: 17 U/L (ref 0–35)
AST: 16 U/L (ref 0–37)
Alkaline Phosphatase: 47 U/L (ref 39–117)
BUN: 12 mg/dL (ref 6–23)
CO2: 27 meq/L (ref 19–32)
CREATININE: 0.72 mg/dL (ref 0.40–1.20)
Calcium: 9.7 mg/dL (ref 8.4–10.5)
Chloride: 103 mEq/L (ref 96–112)
GFR: 96.77 mL/min (ref >60.00)
Glucose, Bld: 90 mg/dL (ref 70–99)
Potassium: 4.2 mEq/L (ref 3.5–5.1)
Sodium: 138 mEq/L (ref 135–145)
Total Bilirubin: 0.7 mg/dL (ref 0.2–1.2)
Total Protein: 7.1 g/dL (ref 6.0–8.3)

## 2018-02-05 LAB — TSH: TSH: 1.31 u[IU]/mL (ref 0.35–4.50)

## 2018-02-05 LAB — LIPID PANEL
CHOL/HDL RATIO: 2
Cholesterol: 107 mg/dL (ref 0–200)
HDL: 57.2 mg/dL (ref >39.00)
LDL CALC: 42 mg/dL (ref 0–99)
NONHDL: 50.27
Triglycerides: 41 mg/dL (ref 0.0–149.0)
VLDL: 8.2 mg/dL (ref 0.0–40.0)

## 2018-02-05 NOTE — Progress Notes (Signed)
Subjective:   Patient ID: [name], female    DOB: 07-26-1981, 38 y.o.   MRN: [id number]  [name] is a pleasant 37 y.o. year old female who presents to clinic today with Annual Exam (Patient is here today for a CPE without PAP.  Dr. Ronita Hipps completed last PAP in July and was WNL.  She is currently fasting.  She has not had a period since February as she had Mirena placed.)  on 02/05/2018  HPI:  Has GYN- Dr. Ronita Hipps, Pap last done in 03/2017 and was WNL. She has a mirena- has not had a period since February.  Has no complaints today.   Current Outpatient Medications on File Prior to Visit  Medication Sig Dispense Refill  . cetirizine (ZYRTEC) 10 MG tablet Take 10 mg by mouth daily.    . cyanocobalamin 2000 MCG tablet Take 2,000 mcg by mouth daily.    . cyclobenzaprine (FLEXERIL) 10 MG tablet Take 1 tablet (10 mg total) by mouth at bedtime as needed for muscle spasms. 10 tablet 0  . ibuprofen (ADVIL,MOTRIN) 200 MG tablet Take 3 tablets (600 mg total) by mouth every 8 (eight) hours as needed (with food).    Marland Kitchen omeprazole (PRILOSEC) 40 MG capsule Take 40 mg by mouth 2 (two) times daily.    . Probiotic Product (PROBIOTIC PO) Take by mouth.    . spironolactone (ALDACTONE) 50 MG tablet Take 50 mg by mouth 2 (two) times daily.  5  . triamcinolone (KENALOG) 0.147 MG/GM topical spray APPLY TO AFFECTED AREA(S) TWICE DAILY 63 g 0  . triamcinolone cream (KENALOG) 0.1 % Apply 1 application topically 2 (two) times daily. 30 g 0   No current facility-administered medications on file prior to visit.     Allergies  Allergen Reactions  . Codeine Nausea And Vomiting  . Gabapentin     Sedation at low doses  . Hydrocodone Nausea And Vomiting  . Morphine And Related Hives and Itching    Past Medical History:  Diagnosis Date  . Allergic rhinitis   . Allergy   . GERD (gastroesophageal reflux disease)   . Gestational diabetes     Past Surgical History:  Procedure Laterality Date  .  CESAREAN SECTION  06, 09   x2  . CHOLECYSTECTOMY  11/22/2011   Procedure: LAPAROSCOPIC CHOLECYSTECTOMY;  Surgeon: Rolm Bookbinder, MD;  Location[facility];  Service: General;  Laterality: N/A;    Family History  Problem Relation Age of Onset  . Healthy Mother   . Colon polyps Father   . Other Father        brain tumor  . Breast cancer Maternal Grandmother   . Alzheimer's disease Maternal Grandmother   . Heart disease Paternal Grandmother   . Diabetes Paternal Grandmother   . Stroke Paternal Grandmother   . Heart attack Paternal Grandmother   . ADD / ADHD Sister   . Migraines Daughter        Abdominal Migraines  . Heart failure Maternal Grandfather   . Stroke Maternal Grandfather   . Hypertension Maternal Grandfather   . Pancreatic cancer Paternal Grandfather     Social History   Socioeconomic History  . Marital status: Married    Spouse name: Not on file  . Number of children: 2  . Years of education: Not on file  . Highest education level: Not on file  Occupational History  . Occupation: Therapist, sports, [employer]  Social Needs  . Financial resource strain: Not  on file  . Food insecurity:    Worry: Not on file    Inability: Not on file  . Transportation needs:    Medical: Not on file    Non-medical: Not on file  Tobacco Use  . Smoking status: Never Smoker  . Smokeless tobacco: Never Used  Substance and Sexual Activity  . Alcohol use: Yes    Comment: occasional  . Drug use: No  . Sexual activity: Yes    Birth control/protection: IUD  Lifestyle  . Physical activity:    Days per week: Not on file    Minutes per session: Not on file  . Stress: Not on file  Relationships  . Social connections:    Talks on phone: Not on file    Gets together: Not on file    Attends religious service: Not on file    Active member of club or organization: Not on file    Attends meetings of clubs or organizations: Not on file    Relationship status: Not on file  . Intimate partner  violence:    Fear of current or ex partner: Not on file    Emotionally abused: Not on file    Physically abused: Not on file    Forced sexual activity: Not on file  Other Topics Concern  . Not on file  Social History Narrative  . Not on file   The PMH, PSH, Social History, Family History, Medications, and allergies have been reviewed in [facility], and have been updated if relevant.  Review of Systems  Constitutional: Negative.   HENT: Negative.   Eyes: Negative.   Respiratory: Negative.   Cardiovascular: Negative.   Gastrointestinal: Negative.   Endocrine: Negative.   Genitourinary: Negative.   Musculoskeletal: Negative.   Allergic/Immunologic: Negative.   Neurological: Negative.   Hematological: Negative.   Psychiatric/Behavioral: Negative.   All other systems reviewed and are negative.      Objective:    BP 106/62 (BP Location: Left Arm, Patient Position: Sitting, Cuff Size: Normal)   Pulse 93   Temp 97.6 F (36.4 C) (Oral)   Ht 5\' 3"  (1.6 m)   Wt 141 lb 3.2 oz (64 kg)   SpO2 99%   BMI 25.01 kg/m   Wt Readings from Last 3 Encounters:  02/05/18 141 lb 3.2 oz (64 kg)  07/28/17 138 lb (62.6 kg)  07/16/17 138 lb 10.7 oz (62.9 kg)    Physical Exam   General:  Well-developed,well-nourished,in no acute distress; alert,appropriate and cooperative throughout examination Head:  normocephalic and atraumatic.   Eyes:  vision grossly intact, PERRL Ears:  R ear normal and L ear normal externally, TMs clear bilaterally Nose:  no external deformity.   Mouth:  good dentition.   Neck:  No deformities, masses, or tenderness noted. Lungs:  Normal respiratory effort, chest expands symmetrically. Lungs are clear to auscultation, no crackles or wheezes. Heart:  Normal rate and regular rhythm. S1 and S2 normal without gallop, murmur, click, rub or other extra sounds. Abdomen:  Bowel sounds positive,abdomen soft and non-tender without masses, organomegaly or hernias noted. Msk:  No  deformity or scoliosis noted of thoracic or lumbar spine.   Extremities:  No clubbing, cyanosis, edema, or deformity noted with normal full range of motion of all joints.   Neurologic:  alert & oriented X3 and gait normal.   Skin:  Intact without suspicious lesions or rashes Cervical Nodes:  No lymphadenopathy noted Axillary Nodes:  No palpable lymphadenopathy Psych:  Cognition and  judgment appear intact. Alert and cooperative with normal attention span and concentration. No apparent delusions, illusions, hallucinations       Assessment & Plan:   Well woman exam without gynecological exam No follow-ups on file.

## 2018-02-05 NOTE — Assessment & Plan Note (Signed)
Reviewed preventive care protocols, scheduled due services, and updated immunizations Discussed nutrition, exercise, diet, and healthy lifestyle.  Orders Placed This Encounter  Procedures  . Comprehensive metabolic panel  . Lipid panel  . TSH  . CBC with Differential/Platelet

## 2018-02-05 NOTE — Patient Instructions (Signed)
Great to see you. I will call you with your lab results from today and you can view them online.   

## 2018-03-20 ENCOUNTER — Encounter: Payer: Self-pay | Admitting: Family Medicine

## 2018-03-23 MED FILL — SPIRONOLACTONE 50 MG TAB: 50 | 30 days supply | Qty: 60 | Fill #1

## 2018-03-24 ENCOUNTER — Ambulatory Visit (INDEPENDENT_AMBULATORY_CARE_PROVIDER_SITE_OTHER): Payer: No Typology Code available for payment source | Admitting: Family Medicine

## 2018-03-24 ENCOUNTER — Encounter: Payer: Self-pay | Admitting: Family Medicine

## 2018-03-24 VITALS — BP 102/60 | HR 70 | Temp 98.0°F | Ht 63.0 in | Wt 139.4 lb

## 2018-03-24 DIAGNOSIS — F418 Other specified anxiety disorders: Secondary | ICD-10-CM

## 2018-03-24 MED ORDER — SERTRALINE HCL 50 MG PO TABS: 50.0000 mg | ORAL_TABLET | Freq: Every day | ORAL | 3 refills | Status: DC

## 2018-03-24 MED FILL — SERTRALINE HCL 50 MG TABLET: 50 | 30 days supply | Qty: 30 | Fill #0

## 2018-03-24 NOTE — Assessment & Plan Note (Signed)
>  25 minutes spent in face to face time with patient, >50% spent in counselling or coordination of care Deteriorated. PHQ 10, GAD 9. Restart Zoloft 50 mg. Patient is to take 1/2 tablet daily for 10 days, then advance to 1 full tablet thereafter. We discussed possible side effects of headache, GI upset, drowsiness, and SI/HI. If thoughts of SI/HI develop, we discussed to present to the emergency immediately. Patient verbalized understanding.

## 2018-03-24 NOTE — Progress Notes (Signed)
Subjective:   Patient ID: [name], female    DOB: 11-01-1980, 37 y.o.   MRN: [id number]  [name] is a pleasant 37 y.o. year old female who presents to clinic today with Depression with Anxiety (Patient is here today to discuss Depression and Anxiety.  She felt last week that she was needing to get help because she was not "snapping out of this or getting better."  She was on Zoloft in the past which worked well for her.  Denies any SI or HI.  Plz see PHQ-GAD.)  on 03/24/2018  HPI:  Depression/anxiety- H/o of anxiety and depression treated with zoloft in past (highest dose she was on was 50 mg daily).  Stopped taking because she felt she was coping okay. Symptoms have been worsening for "awhile" but for the last week she felt she was just not "snapping out of it."  Has felt more anhedonia and tearfulness, doesn't feel like herself for months.  No SI or HI.    Current Outpatient Medications on File Prior to Visit  Medication Sig Dispense Refill  . cetirizine (ZYRTEC) 10 MG tablet Take 10 mg by mouth daily.    . cyanocobalamin 2000 MCG tablet Take 2,000 mcg by mouth daily.    . cyclobenzaprine (FLEXERIL) 10 MG tablet Take 1 tablet (10 mg total) by mouth at bedtime as needed for muscle spasms. 10 tablet 0  . ibuprofen (ADVIL,MOTRIN) 200 MG tablet Take 3 tablets (600 mg total) by mouth every 8 (eight) hours as needed (with food).    Marland Kitchen omeprazole (PRILOSEC) 40 MG capsule Take 40 mg by mouth 2 (two) times daily.    . Probiotic Product (PROBIOTIC PO) Take by mouth.    . spironolactone (ALDACTONE) 50 MG tablet Take 50 mg by mouth 2 (two) times daily.  5  . triamcinolone (KENALOG) 0.147 MG/GM topical spray APPLY TO AFFECTED AREA(S) TWICE DAILY 63 g 0  . triamcinolone cream (KENALOG) 0.1 % Apply 1 application topically 2 (two) times daily. 30 g 0   No current facility-administered medications on file prior to visit.     Allergies  Allergen Reactions  . Codeine Nausea And  Vomiting  . Gabapentin     Sedation at low doses  . Hydrocodone Nausea And Vomiting  . Morphine And Related Hives and Itching    Past Medical History:  Diagnosis Date  . Allergic rhinitis   . Allergy   . GERD (gastroesophageal reflux disease)   . Gestational diabetes     Past Surgical History:  Procedure Laterality Date  . CESAREAN SECTION  06, 09   x2  . CHOLECYSTECTOMY  11/22/2011   Procedure: LAPAROSCOPIC CHOLECYSTECTOMY;  Surgeon: Rolm Bookbinder, MD;  Location: [facility] OR;  Service: General;  Laterality: N/A;    Family History  Problem Relation Age of Onset  . Healthy Mother   . Colon polyps Father   . Other Father        brain tumor  . Breast cancer Maternal Grandmother   . Alzheimer's disease Maternal Grandmother   . Heart disease Paternal Grandmother   . Diabetes Paternal Grandmother   . Stroke Paternal Grandmother   . Heart attack Paternal Grandmother   . ADD / ADHD Sister   . Migraines Daughter        Abdominal Migraines  . Heart failure Maternal Grandfather   . Stroke Maternal Grandfather   . Hypertension Maternal Grandfather   . Pancreatic cancer Paternal Grandfather     Social  History   Socioeconomic History  . Marital status: Married    Spouse name: Not on file  . Number of children: 2  . Years of education: Not on file  . Highest education level: Not on file  Occupational History  . Occupation: Therapist, sports, [employer]  Social Needs  . Financial resource strain: Not on file  . Food insecurity:    Worry: Not on file    Inability: Not on file  . Transportation needs:    Medical: Not on file    Non-medical: Not on file  Tobacco Use  . Smoking status: Never Smoker  . Smokeless tobacco: Never Used  Substance and Sexual Activity  . Alcohol use: Yes    Comment: occasional  . Drug use: No  . Sexual activity: Yes    Birth control/protection: IUD  Lifestyle  . Physical activity:    Days per week: Not on file    Minutes per session: Not on file  .  Stress: Not on file  Relationships  . Social connections:    Talks on phone: Not on file    Gets together: Not on file    Attends religious service: Not on file    Active member of club or organization: Not on file    Attends meetings of clubs or organizations: Not on file    Relationship status: Not on file  . Intimate partner violence:    Fear of current or ex partner: Not on file    Emotionally abused: Not on file    Physically abused: Not on file    Forced sexual activity: Not on file  Other Topics Concern  . Not on file  Social History Narrative  . Not on file   The PMH, PSH, Social History, Family History, Medications, and allergies have been reviewed in [facility], and have been updated if relevant.  Review of Systems  Psychiatric/Behavioral: Positive for dysphoric mood. Negative for agitation, behavioral problems, confusion, decreased concentration, hallucinations, self-injury, sleep disturbance and suicidal ideas. The patient is nervous/anxious. The patient is not hyperactive.   All other systems reviewed and are negative.      Objective:    BP 102/60 (BP Location: Left Arm, Patient Position: Sitting, Cuff Size: Normal)   Pulse 70   Temp 98 F (36.7 C) (Oral)   Ht 5\' 3"  (1.6 m)   Wt 139 lb 6.4 oz (63.2 kg)   SpO2 99%   BMI 24.69 kg/m    Physical Exam  Constitutional: She is oriented to person, place, and time. She appears well-developed and well-nourished. No distress.  HENT:  Head: Normocephalic and atraumatic.  Eyes: EOM are normal.  Neck: Normal range of motion.  Cardiovascular: Normal rate.  Pulmonary/Chest: Effort normal.  Musculoskeletal: Normal range of motion.  Neurological: She is alert and oriented to person, place, and time.  Skin: Skin is warm and dry. She is not diaphoretic.  Psychiatric: She has a normal mood and affect. Her behavior is normal. Judgment and thought content normal.  Nursing note and vitals reviewed.         Assessment & Plan:     Depression with anxiety No follow-ups on file.

## 2018-03-24 NOTE — Patient Instructions (Signed)
Great to see you. Take 1/2 tablet of zoloft (25 mg daily) for 7 -10 days and then increase to full tablet (50 mg daily) thereafter if desired.  Please update in a few weeks.

## 2018-04-28 ENCOUNTER — Encounter: Payer: Self-pay | Admitting: Family Medicine

## 2018-04-29 ENCOUNTER — Other Ambulatory Visit: Payer: Self-pay

## 2018-04-29 MED ORDER — SERTRALINE HCL 100 MG PO TABS: 100.0000 mg | ORAL_TABLET | Freq: Every day | ORAL | 3 refills | Status: DC

## 2018-04-29 MED FILL — SERTRALINE HCL 100 MG TAB: 100 | 30 days supply | Qty: 30 | Fill #0

## 2018-04-29 MED FILL — SPIRONOLACTONE 50 MG TAB: 50 | 30 days supply | Qty: 60 | Fill #0

## 2018-04-29 NOTE — Progress Notes (Signed)
Per TA Matei Magnone/C 50mg /Start 100mg  Sertraline 1qd #30+3 sent to pharm/pt aware via MyChart/thx dmf

## 2018-05-15 ENCOUNTER — Telehealth: Payer: No Typology Code available for payment source | Admitting: Family

## 2018-05-15 DIAGNOSIS — J020 Streptococcal pharyngitis: Secondary | ICD-10-CM | POA: Diagnosis not present

## 2018-05-15 MED ORDER — AMOXICILLIN-POT CLAVULANATE 875-125 MG PO TABS: 1.0000 | ORAL_TABLET | Freq: Two times a day (BID) | ORAL | 0 refills | Status: DC

## 2018-05-15 MED FILL — AMOX-CLAV 875-125 MG TABLET: 875-125 | 7 days supply | Qty: 14 | Fill #0

## 2018-05-15 NOTE — Progress Notes (Signed)

## 2018-06-01 MED FILL — SERTRALINE HCL 100 MG TAB: 100 | 30 days supply | Qty: 30 | Fill #1

## 2018-06-01 MED FILL — SPIRONOLACTONE 50 MG TAB: 50 | 30 days supply | Qty: 60 | Fill #1

## 2018-06-02 ENCOUNTER — Encounter: Payer: Self-pay | Admitting: Family Medicine

## 2018-06-10 ENCOUNTER — Ambulatory Visit: Payer: No Typology Code available for payment source | Admitting: Family Medicine

## 2018-06-17 ENCOUNTER — Encounter: Payer: Self-pay | Admitting: Family Medicine

## 2018-06-17 ENCOUNTER — Ambulatory Visit (INDEPENDENT_AMBULATORY_CARE_PROVIDER_SITE_OTHER): Payer: No Typology Code available for payment source | Admitting: Family Medicine

## 2018-06-17 VITALS — BP 96/58 | HR 82 | Temp 97.9°F | Ht 63.0 in | Wt 136.8 lb

## 2018-06-17 DIAGNOSIS — R140 Abdominal distension (gaseous): Secondary | ICD-10-CM | POA: Diagnosis not present

## 2018-06-17 DIAGNOSIS — R194 Change in bowel habit: Secondary | ICD-10-CM | POA: Insufficient documentation

## 2018-06-17 DIAGNOSIS — K921 Melena: Secondary | ICD-10-CM

## 2018-06-17 DIAGNOSIS — K219 Gastro-esophageal reflux disease without esophagitis: Secondary | ICD-10-CM | POA: Diagnosis not present

## 2018-06-17 LAB — CBC WITH DIFFERENTIAL/PLATELET
BASOS ABS: 0.1 10*3/uL (ref 0.0–0.1)
Basophils Relative: 0.8 % (ref 0.0–3.0)
EOS PCT: 0.7 % (ref 0.0–5.0)
Eosinophils Absolute: 0 10*3/uL (ref 0.0–0.7)
HCT: 39.7 % (ref 36.0–46.0)
HEMOGLOBIN: 13.3 g/dL (ref 12.0–15.0)
LYMPHS ABS: 2.3 10*3/uL (ref 0.7–4.0)
Lymphocytes Relative: 32.5 % (ref 12.0–46.0)
MCHC: 33.4 g/dL (ref 30.0–36.0)
MCV: 85.4 fl (ref 78.0–100.0)
MONO ABS: 0.3 10*3/uL (ref 0.1–1.0)
Monocytes Relative: 4.3 % (ref 3.0–12.0)
Neutro Abs: 4.3 10*3/uL (ref 1.4–7.7)
Neutrophils Relative %: 61.7 % (ref 43.0–77.0)
Platelets: 248 10*3/uL (ref 150.0–400.0)
RBC: 4.65 Mil/uL (ref 3.87–5.11)
RDW: 15 % (ref 11.5–15.5)
WBC: 7 10*3/uL (ref 4.0–10.5)

## 2018-06-17 LAB — COMPREHENSIVE METABOLIC PANEL
ALBUMIN: 4.5 g/dL (ref 3.5–5.2)
ALK PHOS: 54 U/L (ref 39–117)
ALT: 12 U/L (ref 0–35)
AST: 15 U/L (ref 0–37)
BILIRUBIN TOTAL: 0.4 mg/dL (ref 0.2–1.2)
BUN: 10 mg/dL (ref 6–23)
CO2: 26 meq/L (ref 19–32)
Calcium: 9.3 mg/dL (ref 8.4–10.5)
Chloride: 104 mEq/L (ref 96–112)
Creatinine, Ser: 0.77 mg/dL (ref 0.40–1.20)
GFR: 89.38 mL/min (ref >60.00)
Glucose, Bld: 108 mg/dL — ABNORMAL HIGH (ref 70–99)
Potassium: 3.8 mEq/L (ref 3.5–5.1)
SODIUM: 138 meq/L (ref 135–145)
TOTAL PROTEIN: 7.5 g/dL (ref 6.0–8.3)

## 2018-06-17 NOTE — Patient Instructions (Signed)
Great to see you. I will call you with your lab results from today and you can view them online.   Try Align OTC probiotic.  We are referring you to [facility].

## 2018-06-17 NOTE — Assessment & Plan Note (Signed)
Deteriorated. Check H Pylori today. Refer back to GI. Continue PPI, can add H2 blocker as well.

## 2018-06-17 NOTE — Assessment & Plan Note (Signed)
New- although she has had issues with bloating and abdominal pain in past. Likely has underlying IBS.  Bowel changes with bloody stools are new for her.  She has seen GI- neg endoscopy last year.  I do feel given duration of new symptoms, colonoscopy is warranted to rule out IBD, etc.  Refer back to GI.  Referral placed.  Advised starting Align daily probiotic.

## 2018-06-17 NOTE — Progress Notes (Signed)
Subjective:   Patient ID: [name], female    DOB: 1981-01-28, 37 y.o.   MRN: [id number]  [name] is a pleasant 37 y.o. year old female who presents to clinic today with Hematochezia (Patient is here today C/O blood in stool for a while.  States that it has become more frequent.  There is bright red blood in brown stool but there is no blood when she wipes. Denies any black stools.  Has a H/O internal and external hemorrhoids.  She is having intermittent problems with bloating, burping, abdominal pain all over.  Has been to [facility] in the past and is willing to go there.  Has loose to watery stools and goes tid.)  on 06/17/2018  HPI:  Bowel habit changes - several loose/watery stools, intermittent bloody stools for months. ?mucous stools.  Increased frequency of stools.  Not consistent, intermittent but does feel like severity of symptoms has worsened.  She feels like her "gut hurts all over."  Pain does improve sometimes with defecation.  No family h/o IBD.   Worsening burping/bloating- Taking omeprazole 40 mg daily.  She is not sure if it's helping. Taking as needed pepto bismol which does help at times. No black stools.  Had neg endoscopy on 10/17/16.  Reviewed.   Current Outpatient Medications on File Prior to Visit  Medication Sig Dispense Refill  . cetirizine (ZYRTEC) 10 MG tablet Take 10 mg by mouth daily.    . cyanocobalamin 2000 MCG tablet Take 2,000 mcg by mouth daily.    . cyclobenzaprine (FLEXERIL) 10 MG tablet Take 1 tablet (10 mg total) by mouth at bedtime as needed for muscle spasms. 10 tablet 0  . ibuprofen (ADVIL,MOTRIN) 200 MG tablet Take 3 tablets (600 mg total) by mouth every 8 (eight) hours as needed (with food).    Marland Kitchen omeprazole (PRILOSEC) 40 MG capsule Take 40 mg by mouth daily.     . Probiotic Product (PROBIOTIC PO) Take by mouth.    . sertraline (ZOLOFT) 100 MG tablet Take 1 tablet (100 mg total) by mouth daily. 30 tablet 3  . spironolactone  (ALDACTONE) 50 MG tablet Take 50 mg by mouth 2 (two) times daily.  5  . triamcinolone (KENALOG) 0.147 MG/GM topical spray APPLY TO AFFECTED AREA(S) TWICE DAILY 63 g 0  . triamcinolone cream (KENALOG) 0.1 % Apply 1 application topically 2 (two) times daily. 30 g 0   No current facility-administered medications on file prior to visit.     Allergies  Allergen Reactions  . Codeine Nausea And Vomiting  . Gabapentin     Sedation at low doses  . Hydrocodone Nausea And Vomiting  . Morphine And Related Hives and Itching    Past Medical History:  Diagnosis Date  . Allergic rhinitis   . Allergy   . GERD (gastroesophageal reflux disease)   . Gestational diabetes     Past Surgical History:  Procedure Laterality Date  . CESAREAN SECTION  06, 09   x2  . CHOLECYSTECTOMY  11/22/2011   Procedure: LAPAROSCOPIC CHOLECYSTECTOMY;  Surgeon: Rolm Bookbinder, MD;  Location: [facility] OR;  Service: General;  Laterality: N/A;    Family History  Problem Relation Age of Onset  . Healthy Mother   . Colon polyps Father   . Other Father        brain tumor  . Breast cancer Maternal Grandmother   . Alzheimer's disease Maternal Grandmother   . Heart disease Paternal Grandmother   . Diabetes  Paternal Grandmother   . Stroke Paternal Grandmother   . Heart attack Paternal Grandmother   . ADD / ADHD Sister   . Migraines Daughter        Abdominal Migraines  . Heart failure Maternal Grandfather   . Stroke Maternal Grandfather   . Hypertension Maternal Grandfather   . Pancreatic cancer Paternal Grandfather     Social History   Socioeconomic History  . Marital status: Married    Spouse name: Not on file  . Number of children: 2  . Years of education: Not on file  . Highest education level: Not on file  Occupational History  . Occupation: Therapist, sports, [employer]  Social Needs  . Financial resource strain: Not on file  . Food insecurity:    Worry: Not on file    Inability: Not on file  . Transportation  needs:    Medical: Not on file    Non-medical: Not on file  Tobacco Use  . Smoking status: Never Smoker  . Smokeless tobacco: Never Used  Substance and Sexual Activity  . Alcohol use: Yes    Comment: occasional  . Drug use: No  . Sexual activity: Yes    Birth control/protection: IUD  Lifestyle  . Physical activity:    Days per week: Not on file    Minutes per session: Not on file  . Stress: Not on file  Relationships  . Social connections:    Talks on phone: Not on file    Gets together: Not on file    Attends religious service: Not on file    Active member of club or organization: Not on file    Attends meetings of clubs or organizations: Not on file    Relationship status: Not on file  . Intimate partner violence:    Fear of current or ex partner: Not on file    Emotionally abused: Not on file    Physically abused: Not on file    Forced sexual activity: Not on file  Other Topics Concern  . Not on file  Social History Narrative  . Not on file   The PMH, PSH, Social History, Family History, Medications, and allergies have been reviewed in [facility], and have been updated if relevant.   Review of Systems  Constitutional: Positive for fatigue.  Gastrointestinal: Positive for abdominal distention, abdominal pain, blood in stool, diarrhea and nausea. Negative for constipation and vomiting.  Genitourinary: Negative.   Musculoskeletal: Negative.   Skin: Negative.   All other systems reviewed and are negative.      Objective:    BP (!) 96/58 (BP Location: Left Arm, Patient Position: Sitting, Cuff Size: Normal)   Pulse 82   Temp 97.9 F (36.6 C) (Oral)   Ht 5\' 3"  (1.6 m)   Wt 136 lb 12.8 oz (62.1 kg)   SpO2 98%   BMI 24.23 kg/m    Physical Exam  Constitutional: She is oriented to person, place, and time. She appears well-developed and well-nourished. No distress.  HENT:  Head: Normocephalic and atraumatic.  Eyes: EOM are normal.  Neck: Normal range of motion.    Cardiovascular: Normal rate.  Pulmonary/Chest: Effort normal.  Abdominal: Soft. She exhibits distension. She exhibits no mass. There is no tenderness. There is no rebound and no guarding. No hernia.  Neurological: She is alert and oriented to person, place, and time. No cranial nerve deficit.  Skin: She is not diaphoretic.  Psychiatric: She has a normal mood and affect. Her  behavior is normal. Judgment and thought content normal.  Nursing note and vitals reviewed.         Assessment & Plan:   Bowel habit changes  Bloody stool No follow-ups on file.

## 2018-06-19 LAB — H. PYLORI ANTIBODY, IGA: H. pylori, IgA Abs: <9 units (ref 0.0–8.9)

## 2018-06-24 ENCOUNTER — Telehealth: Payer: Self-pay | Admitting: Emergency Medicine

## 2018-06-24 ENCOUNTER — Ambulatory Visit (INDEPENDENT_AMBULATORY_CARE_PROVIDER_SITE_OTHER): Payer: No Typology Code available for payment source | Admitting: Physician Assistant

## 2018-06-24 ENCOUNTER — Encounter: Payer: Self-pay | Admitting: Physician Assistant

## 2018-06-24 VITALS — BP 100/60 | HR 72 | Ht 63.0 in | Wt 134.0 lb

## 2018-06-24 DIAGNOSIS — R194 Change in bowel habit: Secondary | ICD-10-CM

## 2018-06-24 DIAGNOSIS — R140 Abdominal distension (gaseous): Secondary | ICD-10-CM | POA: Diagnosis not present

## 2018-06-24 DIAGNOSIS — R1084 Generalized abdominal pain: Secondary | ICD-10-CM | POA: Diagnosis not present

## 2018-06-24 DIAGNOSIS — K625 Hemorrhage of anus and rectum: Secondary | ICD-10-CM

## 2018-06-24 MED ORDER — HYDROCORTISONE 1 % EX OINT: 1.0000 "application " | TOPICAL_OINTMENT | Freq: Two times a day (BID) | CUTANEOUS | 0 refills | Status: DC

## 2018-06-24 MED ORDER — CHOLESTYRAMINE 4 G PO PACK: 4.0000 g | PACK | Freq: Two times a day (BID) | ORAL | 12 refills | Status: DC

## 2018-06-24 MED FILL — CHOLESTYRAMINE PACKET: 4 | 30 days supply | Qty: 60 | Fill #0

## 2018-06-24 MED FILL — SPIRONOLACTONE 50 MG TABLET: 50 | 30 days supply | Qty: 60 | Fill #0

## 2018-06-24 MED FILL — SERTRALINE HCL 100 MG TAB: 100 | 30 days supply | Qty: 30 | Fill #2

## 2018-06-24 NOTE — Telephone Encounter (Signed)
Spoke to pharmacist at [facility] and there is a drug interaction with Spironolactone and Cholestyramine. She states that because of this her potassium and chloride levels need to be monitored while she is on this medication. Spoke with Ellouise Newer, PA and informed her, she conferred with Tye Savoy, NP ok for patient to take cholestyramine but she will need to space it out at least 2 hours awy from spironolactone. Pharmacist states she will inform patient how to take medicine and we will check labs at her next visit in 4 weeks.

## 2018-06-24 NOTE — Patient Instructions (Signed)
We have sent the following medications to your pharmacy for you to pick up at your convenience:  Cholestyramine 1 packet twice a day   Your provider suggests that you use Hydrocortisone ointment to treat your symptoms. This can be applied to a prep H suppository which can be purchased over the counter. Apply the suppository into the rectum twice daily for the next 7 days.

## 2018-06-24 NOTE — Progress Notes (Signed)
Chief Complaint: Abdominal pain, rectal bleeding  HPI:    [name] is a 37 year old Caucasian female with a past medical history as listed below, assigned to Dr. Silverio Decamp, who presents to clinic today with a complaint of abdominal pain and rectal bleeding.    10/09/2016 office visit with me to discuss continued abdominal pain regardless of Omeprazole 40 mg twice daily.  Recent CT had been done 09/27/2016 which showed no acute abnormality.  At that time it was recommended to proceed with an EGD for further evaluation.    10/17/2016 EGD was normal.    CBC, CMP and H. pylori antibody IgA 06/17/2018 were negative/normal.    Today, patient tells me that she has continued with intermittent symptoms which are slightly changed from previous.  Describes that since May she has had at least 4-5 days out of the week where she is bothered by abdominal bloating and excessive burping accompanied by generalized abdominal soreness and nausea.  Patient restarted her Omeprazole 40 mg daily and this has maybe helped "slightly".  She also takes Pepto-Bismol occasionally which sometimes help.      Tells me that she has had an increase in days of urgent looser stool, sometimes 3-5 looser stools per day.  Denies preceding abdominal cramping.  This is intermixed with days of soft solid stool.  Patient does note she has always had a "nervous stomach", and describes an increase in stress lately.  Associated symptoms include some days of seeing bright red blood mixed in with her stool/on top of her stool.  Denies any rectal pain.    Medical history is positive for being status post cholecystectomy.    Denies fever, chills, weight loss, anorexia, vomiting or symptoms that awaken her from sleep.  Past Medical History:  Diagnosis Date  . Allergic rhinitis   . Allergy   . GERD (gastroesophageal reflux disease)   . Gestational diabetes     Past Surgical History:  Procedure Laterality Date  . CESAREAN SECTION  06, 09   x2  .  CHOLECYSTECTOMY  11/22/2011   Procedure: LAPAROSCOPIC CHOLECYSTECTOMY;  Surgeon: Rolm Bookbinder, MD;  Location: [facility] OR;  Service: General;  Laterality: N/A;    Current Outpatient Medications  Medication Sig Dispense Refill  . cetirizine (ZYRTEC) 10 MG tablet Take 10 mg by mouth daily.    . cyanocobalamin 2000 MCG tablet Take 2,000 mcg by mouth daily.    . cyclobenzaprine (FLEXERIL) 10 MG tablet Take 1 tablet (10 mg total) by mouth at bedtime as needed for muscle spasms. 10 tablet 0  . ibuprofen (ADVIL,MOTRIN) 200 MG tablet Take 3 tablets (600 mg total) by mouth every 8 (eight) hours as needed (with food).    Marland Kitchen omeprazole (PRILOSEC) 40 MG capsule Take 40 mg by mouth daily.     . Probiotic Product (PROBIOTIC PO) Take by mouth.    . sertraline (ZOLOFT) 100 MG tablet Take 1 tablet (100 mg total) by mouth daily. 30 tablet 3  . spironolactone (ALDACTONE) 50 MG tablet Take 50 mg by mouth 2 (two) times daily.  5  . triamcinolone (KENALOG) 0.147 MG/GM topical spray APPLY TO AFFECTED AREA(S) TWICE DAILY 63 g 0  . triamcinolone cream (KENALOG) 0.1 % Apply 1 application topically 2 (two) times daily. 30 g 0  . cholestyramine (QUESTRAN) 4 g packet Take 1 packet (4 g total) by mouth 2 (two) times daily. 60 each 12  . hydrocortisone 1 % ointment Apply 1 application topically 2 (two) times daily. [place]  g 0   No current facility-administered medications for this visit.     Allergies as of 06/24/2018 - Review Complete 06/24/2018  Allergen Reaction Noted  . Codeine Nausea And Vomiting   . Gabapentin  03/03/2014  . Hydrocodone Nausea And Vomiting 11/18/2011  . Morphine and related Hives and Itching 11/18/2011    Family History  Problem Relation Age of Onset  . Healthy Mother   . Colon polyps Father   . Other Father        brain tumor  . Breast cancer Maternal Grandmother   . Alzheimer's disease Maternal Grandmother   . Heart disease Paternal Grandmother   . Diabetes Paternal Grandmother   . Stroke  Paternal Grandmother   . Heart attack Paternal Grandmother   . ADD / ADHD Sister   . Migraines Daughter        Abdominal Migraines  . Heart failure Maternal Grandfather   . Stroke Maternal Grandfather   . Hypertension Maternal Grandfather   . Pancreatic cancer Paternal Grandfather     Social History   Socioeconomic History  . Marital status: Married    Spouse name: Not on file  . Number of children: 2  . Years of education: Not on file  . Highest education level: Not on file  Occupational History  . Occupation: Therapist, sports, [employer]  Social Needs  . Financial resource strain: Not on file  . Food insecurity:    Worry: Not on file    Inability: Not on file  . Transportation needs:    Medical: Not on file    Non-medical: Not on file  Tobacco Use  . Smoking status: Never Smoker  . Smokeless tobacco: Never Used  Substance and Sexual Activity  . Alcohol use: Yes    Comment: occasional  . Drug use: No  . Sexual activity: Yes    Birth control/protection: IUD  Lifestyle  . Physical activity:    Days per week: Not on file    Minutes per session: Not on file  . Stress: Not on file  Relationships  . Social connections:    Talks on phone: Not on file    Gets together: Not on file    Attends religious service: Not on file    Active member of club or organization: Not on file    Attends meetings of clubs or organizations: Not on file    Relationship status: Not on file  . Intimate partner violence:    Fear of current or ex partner: Not on file    Emotionally abused: Not on file    Physically abused: Not on file    Forced sexual activity: Not on file  Other Topics Concern  . Not on file  Social History Narrative  . Not on file    Review of Systems:    Constitutional: No weight loss, fever or chills Cardiovascular: No chest pain Respiratory: No SOB  Gastrointestinal: See HPI and otherwise negative   Physical Exam:  Vital signs: BP 100/60   Pulse 72   Ht 5\' 3"  (1.6  m)   Wt 134 lb (60.8 kg)   BMI 23.74 kg/m   Constitutional:   Pleasant Caucasian female appears to be in NAD, Well developed, Well nourished, alert and cooperative Respiratory: Respirations even and unlabored. Lungs clear to auscultation bilaterally.   No wheezes, crackles, or rhonchi.  Cardiovascular: Normal S1, S2. No MRG. Regular rate and rhythm. No peripheral edema, cyanosis or pallor.  Gastrointestinal:  Soft, nondistended,  nontender. No rebound or guarding. Normal bowel sounds. No appreciable masses or hepatomegaly. Rectal:  External: external hemorrhoid tag; Internal: no ttp, Grade I internal hemorrhoids Psychiatric: Demonstrates good judgement and reason without abnormal affect or behaviors.  MOST RECENT LABS AND IMAGING: CBC    Component Value Date/Time   WBC 7.0 06/17/2018 1154   RBC 4.65 06/17/2018 1154   HGB 13.3 06/17/2018 1154   HCT 39.7 06/17/2018 1154   PLT 248.0 06/17/2018 1154   MCV 85.4 06/17/2018 1154   MCH 31.3 09/20/2016 0825   MCHC 33.4 06/17/2018 1154   RDW 15.0 06/17/2018 1154   LYMPHSABS 2.3 06/17/2018 1154   MONOABS 0.3 06/17/2018 1154   EOSABS 0.0 06/17/2018 1154   BASOSABS 0.1 06/17/2018 1154    CMP     Component Value Date/Time   NA 138 06/17/2018 1154   K 3.8 06/17/2018 1154   CL 104 06/17/2018 1154   CO2 26 06/17/2018 1154   GLUCOSE 108 (H) 06/17/2018 1154   BUN 10 06/17/2018 1154   CREATININE 0.77 06/17/2018 1154   CALCIUM 9.3 06/17/2018 1154   PROT 7.5 06/17/2018 1154   ALBUMIN 4.5 06/17/2018 1154   AST 15 06/17/2018 1154   ALT 12 06/17/2018 1154   ALKPHOS 54 06/17/2018 1154   BILITOT 0.4 06/17/2018 1154   GFRNONAA >60 09/20/2016 0825   GFRAA >60 09/20/2016 0825    Assessment: 1.  Generalized abdominal pain: Typically after "bad days", consider relation to IBS versus other 2.  Bloating: Increased over the past few weeks, consider relation to below 3.  Change in bowel habits: This is a generalized abdominal pain, "anxious  stomach" per patient, status post cholecystectomy; consider IBS +/- post-cholecystectomy diarrhea 4.  Rectal bleeding: Intermittent, internal hemorrhoids seen at time of exam today, likely the cause  Plan: 1.  Prescribed Preparation H suppositories with Hydrocortisone cream twice daily x7 days with 1 refill 2.  Prescribed Cholestyramine 1 packet twice daily #60 with 1 refill 3.  Discussed with patient that if above does not help, she needs to call her clinic on Monday and let us know.  At that time we can try Dicyclomine 10 mg 4 times daily, 20 to 30 minutes before meals and at bedtime. 4.  Patient continue Omeprazole 40 mg daily as this has helped some 5.  Patient to follow with me in 4 to 6 weeks.  If she does not have improvement, would recommend a colonoscopy for further evaluation.  Ellouise Newer, PA-C [facility] 06/24/2018, 9:58 AM  Cc: Lucille Passy, MD

## 2018-06-30 NOTE — Progress Notes (Signed)
Reviewed and agree with documentation and assessment and plan. [name] , MD   

## 2018-07-01 ENCOUNTER — Telehealth: Payer: Self-pay | Admitting: Physician Assistant

## 2018-07-01 NOTE — Telephone Encounter (Signed)
The pt has been taking 4 g of cholestyramine BID and now has developed constipation and nausea.  She was advised to stop the cholestyramine and if diarrhea returns take it only once daily and call back and update on her symptoms.  The pt has been advised of the information and verbalized understanding.

## 2018-07-01 NOTE — Telephone Encounter (Signed)
Pt called regarding the message you sent her, she has been taking [place] and she thinks it is making her nauseous, also said that her stool became very larger and hard to pass. Pls call her.

## 2018-07-29 ENCOUNTER — Encounter (HOSPITAL_COMMUNITY): Payer: Self-pay | Admitting: Emergency Medicine

## 2018-07-29 ENCOUNTER — Other Ambulatory Visit: Payer: Self-pay

## 2018-07-29 ENCOUNTER — Emergency Department (HOSPITAL_COMMUNITY): Payer: PRIVATE HEALTH INSURANCE

## 2018-07-29 ENCOUNTER — Observation Stay (HOSPITAL_COMMUNITY)
Admission: EM | Admit: 2018-07-29 | Discharge: 2018-08-01 | Disposition: A | Discharge status: Home / Self Care | Payer: PRIVATE HEALTH INSURANCE | Attending: Student | Admitting: Student

## 2018-07-29 DIAGNOSIS — Z419 Encounter for procedure for purposes other than remedying health state, unspecified: Secondary | ICD-10-CM

## 2018-07-29 DIAGNOSIS — S82122A Displaced fracture of lateral condyle of left tibia, initial encounter for closed fracture: Secondary | ICD-10-CM | POA: Diagnosis present

## 2018-07-29 DIAGNOSIS — K219 Gastro-esophageal reflux disease without esophagitis: Secondary | ICD-10-CM | POA: Diagnosis present

## 2018-07-29 DIAGNOSIS — Z888 Allergy status to other drugs, medicaments and biological substances status: Secondary | ICD-10-CM | POA: Insufficient documentation

## 2018-07-29 DIAGNOSIS — Z885 Allergy status to narcotic agent status: Secondary | ICD-10-CM | POA: Diagnosis not present

## 2018-07-29 DIAGNOSIS — S82142A Displaced bicondylar fracture of left tibia, initial encounter for closed fracture: Secondary | ICD-10-CM | POA: Diagnosis not present

## 2018-07-29 DIAGNOSIS — S83282A Other tear of lateral meniscus, current injury, left knee, initial encounter: Secondary | ICD-10-CM | POA: Diagnosis present

## 2018-07-29 DIAGNOSIS — S83262A Peripheral tear of lateral meniscus, current injury, left knee, initial encounter: Secondary | ICD-10-CM | POA: Insufficient documentation

## 2018-07-29 DIAGNOSIS — S82202A Unspecified fracture of shaft of left tibia, initial encounter for closed fracture: Secondary | ICD-10-CM | POA: Insufficient documentation

## 2018-07-29 DIAGNOSIS — T148XXA Other injury of unspecified body region, initial encounter: Secondary | ICD-10-CM

## 2018-07-29 HISTORY — DX: Displaced fracture of lateral condyle of left tibia, initial encounter for closed fracture: S82.122A

## 2018-07-29 HISTORY — DX: Other seasonal allergic rhinitis: J30.2

## 2018-07-29 LAB — URINALYSIS, ROUTINE W REFLEX MICROSCOPIC
BILIRUBIN URINE: NEGATIVE
GLUCOSE, UA: NEGATIVE mg/dL
Hgb urine dipstick: NEGATIVE
KETONES UR: NEGATIVE mg/dL
Leukocytes, UA: NEGATIVE
Nitrite: NEGATIVE
PH: 7 (ref 5.0–8.0)
Protein, ur: NEGATIVE mg/dL
Specific Gravity, Urine: 1.008 (ref 1.005–1.030)

## 2018-07-29 LAB — CBC WITH DIFFERENTIAL/PLATELET
Abs Immature Granulocytes: 0.03 10*3/uL (ref 0.00–0.07)
BASOS PCT: 1 %
Basophils Absolute: 0.1 10*3/uL (ref 0.0–0.1)
Eosinophils Absolute: 0.1 10*3/uL (ref 0.0–0.5)
Eosinophils Relative: 1 %
HCT: 43.2 % (ref 36.0–46.0)
Hemoglobin: 13.6 g/dL (ref 12.0–15.0)
IMMATURE GRANULOCYTES: 1 %
Lymphocytes Relative: 34 %
Lymphs Abs: 2 10*3/uL (ref 0.7–4.0)
MCH: 28.3 pg (ref 26.0–34.0)
MCHC: 31.5 g/dL (ref 30.0–36.0)
MCV: 89.8 fL (ref 80.0–100.0)
MONOS PCT: 4 %
Monocytes Absolute: 0.2 10*3/uL (ref 0.1–1.0)
NEUTROS ABS: 3.6 10*3/uL (ref 1.7–7.7)
NEUTROS PCT: 59 %
NRBC: 0 % (ref 0.0–0.2)
PLATELETS: 270 10*3/uL (ref 150–400)
RBC: 4.81 MIL/uL (ref 3.87–5.11)
RDW: 14.7 % (ref 11.5–15.5)
WBC: 5.9 10*3/uL (ref 4.0–10.5)

## 2018-07-29 LAB — SURGICAL PCR SCREEN
MRSA, PCR: NEGATIVE
STAPHYLOCOCCUS AUREUS: NEGATIVE

## 2018-07-29 LAB — BASIC METABOLIC PANEL
ANION GAP: 8 (ref 5–15)
BUN: 11 mg/dL (ref 6–20)
CALCIUM: 9.1 mg/dL (ref 8.9–10.3)
CO2: 23 mmol/L (ref 22–32)
Chloride: 107 mmol/L (ref 98–111)
Creatinine, Ser: 0.83 mg/dL (ref 0.44–1.00)
GFR calc Af Amer: >60 mL/min (ref >60)
GLUCOSE: 96 mg/dL (ref 70–99)
Potassium: 3.7 mmol/L (ref 3.5–5.1)
Sodium: 138 mmol/L (ref 135–145)

## 2018-07-29 LAB — HCG, QUANTITATIVE, PREGNANCY: hCG, Beta Chain, Quant, S: <1 m[IU]/mL (ref <5)

## 2018-07-29 MED ORDER — FENTANYL CITRATE (PF) 100 MCG/2ML IJ SOLN
50.0000 ug | Freq: Once | INTRAMUSCULAR | Status: AC
  Administered 2018-07-29: 50 ug via INTRAVENOUS

## 2018-07-29 MED ORDER — ONDANSETRON HCL 4 MG PO TABS: 4.0000 mg | ORAL_TABLET | Freq: Four times a day (QID) | ORAL | Status: DC | PRN

## 2018-07-29 MED ORDER — METHOCARBAMOL 500 MG PO TABS
500.0000 mg | ORAL_TABLET | Freq: Four times a day (QID) | ORAL | Status: DC | PRN
  Administered 2018-07-29 – 2018-08-01 (×6): 500 mg via ORAL
  Filled 2018-07-29 (×9): qty 1

## 2018-07-29 MED ORDER — FENTANYL CITRATE (PF) 100 MCG/2ML IJ SOLN
50.0000 ug | INTRAMUSCULAR | Status: DC | PRN
  Administered 2018-07-29 – 2018-07-31 (×3): 50 ug via INTRAVENOUS
  Filled 2018-07-29 (×4): qty 2

## 2018-07-29 MED ORDER — ENOXAPARIN SODIUM 40 MG/0.4ML ~~LOC~~ SOLN
40.0000 mg | SUBCUTANEOUS | Status: AC
  Administered 2018-07-29: 40 mg via SUBCUTANEOUS
  Filled 2018-07-29: qty 0.4

## 2018-07-29 MED ORDER — TRAMADOL HCL 50 MG PO TABS
50.0000 mg | ORAL_TABLET | Freq: Four times a day (QID) | ORAL | Status: DC | PRN
  Administered 2018-07-29 – 2018-07-31 (×5): 100 mg via ORAL
  Filled 2018-07-29 (×5): qty 2

## 2018-07-29 MED ORDER — CHLORHEXIDINE GLUCONATE 4 % EX LIQD: 60.0000 mL | Freq: Once | CUTANEOUS | Status: DC

## 2018-07-29 MED ORDER — FENTANYL CITRATE (PF) 100 MCG/2ML IJ SOLN
INTRAMUSCULAR | Status: AC
  Filled 2018-07-29: qty 2

## 2018-07-29 MED ORDER — CEFAZOLIN SODIUM-DEXTROSE 2-4 GM/100ML-% IV SOLN
2.0000 g | INTRAVENOUS | Status: AC
  Administered 2018-07-30: 2 g via INTRAVENOUS
  Filled 2018-07-29: qty 100

## 2018-07-29 MED ORDER — POVIDONE-IODINE 10 % EX SWAB: 2.0000 "application " | Freq: Once | CUTANEOUS | Status: DC

## 2018-07-29 MED ORDER — FENTANYL CITRATE (PF) 100 MCG/2ML IJ SOLN
50.0000 ug | Freq: Once | INTRAMUSCULAR | Status: AC
  Administered 2018-07-29: 50 ug via INTRAVENOUS
  Filled 2018-07-29: qty 2

## 2018-07-29 MED ORDER — ACETAMINOPHEN 650 MG RE SUPP: 650.0000 mg | Freq: Four times a day (QID) | RECTAL | Status: DC | PRN

## 2018-07-29 MED ORDER — ACETAMINOPHEN 325 MG PO TABS
650.0000 mg | ORAL_TABLET | Freq: Four times a day (QID) | ORAL | Status: DC | PRN
  Administered 2018-07-29 – 2018-08-01 (×3): 650 mg via ORAL
  Filled 2018-07-29 (×4): qty 2

## 2018-07-29 MED ORDER — LACTATED RINGERS IV BOLUS
1000.0000 mL | Freq: Once | INTRAVENOUS | Status: AC
  Administered 2018-07-29: 1000 mL via INTRAVENOUS

## 2018-07-29 MED ORDER — METHOCARBAMOL 1000 MG/10ML IJ SOLN
500.0000 mg | Freq: Four times a day (QID) | INTRAVENOUS | Status: DC | PRN
  Filled 2018-07-29: qty 5

## 2018-07-29 MED ORDER — ONDANSETRON HCL 4 MG/2ML IJ SOLN
4.0000 mg | Freq: Four times a day (QID) | INTRAMUSCULAR | Status: DC | PRN
  Administered 2018-07-29 – 2018-07-30 (×3): 4 mg via INTRAVENOUS
  Filled 2018-07-29 (×3): qty 2

## 2018-07-29 MED ORDER — ONDANSETRON HCL 4 MG/2ML IJ SOLN
4.0000 mg | Freq: Once | INTRAMUSCULAR | Status: AC
  Administered 2018-07-29: 4 mg via INTRAVENOUS
  Filled 2018-07-29: qty 2

## 2018-07-29 NOTE — Progress Notes (Signed)
Received pt from ED, A&O x4. Moderate pain to left knee, ice pack applied. Left knee immobilizer applied by ortho tech. Consent for surgery signed by pt.

## 2018-07-29 NOTE — ED Notes (Signed)
Pt has been unable to use restroom with purewick or bedpan. Dr Ronnald Nian notified and ordered foley.

## 2018-07-29 NOTE — ED Notes (Signed)
ED Provider along with Ortho PA at bedside.
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
## 2018-07-29 NOTE — ED Notes (Signed)
Pure wick placed with 2 female visitors who are RN's at bedside.

## 2018-07-29 NOTE — Progress Notes (Signed)
Orthopedic Tech Progress Note Patient Details:  [name] [facility] 09-19-1980 [id number]  Ortho Devices Type of Ortho Device: Knee Immobilizer Ortho Device/Splint Location: lle Ortho Device/Splint Interventions: Ordered, Application, Adjustment   Post Interventions Patient Tolerated: Well Instructions Provided: Care of device, Adjustment of device   [name] 07/29/2018, 6:13 PM

## 2018-07-29 NOTE — ED Notes (Signed)
Pt taken to CT scan. Had immediate relief from foley insertion.

## 2018-07-29 NOTE — ED Notes (Signed)
Pt back from x-ray.
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
## 2018-07-30 ENCOUNTER — Observation Stay (HOSPITAL_COMMUNITY): Payer: PRIVATE HEALTH INSURANCE | Admitting: Certified Registered Nurse Anesthetist

## 2018-07-30 ENCOUNTER — Encounter (HOSPITAL_COMMUNITY): Payer: Self-pay | Admitting: Orthopedic Surgery

## 2018-07-30 ENCOUNTER — Observation Stay (HOSPITAL_COMMUNITY): Payer: PRIVATE HEALTH INSURANCE

## 2018-07-30 ENCOUNTER — Encounter (HOSPITAL_COMMUNITY)
Admission: EM | Disposition: A | Discharge status: Home / Self Care | Payer: Self-pay | Source: Home / Self Care | Attending: Emergency Medicine

## 2018-07-30 DIAGNOSIS — S83262A Peripheral tear of lateral meniscus, current injury, left knee, initial encounter: Secondary | ICD-10-CM | POA: Diagnosis not present

## 2018-07-30 DIAGNOSIS — S82142A Displaced bicondylar fracture of left tibia, initial encounter for closed fracture: Secondary | ICD-10-CM | POA: Diagnosis not present

## 2018-07-30 DIAGNOSIS — S83282A Other tear of lateral meniscus, current injury, left knee, initial encounter: Secondary | ICD-10-CM | POA: Diagnosis not present

## 2018-07-30 DIAGNOSIS — Z888 Allergy status to other drugs, medicaments and biological substances status: Secondary | ICD-10-CM | POA: Diagnosis not present

## 2018-07-30 HISTORY — PX: ORIF TIBIA PLATEAU: SHX2132

## 2018-07-30 LAB — HIV ANTIBODY (ROUTINE TESTING W REFLEX): HIV Screen 4th Generation wRfx: NONREACTIVE

## 2018-07-30 SURGERY — OPEN REDUCTION INTERNAL FIXATION (ORIF) TIBIAL PLATEAU
Anesthesia: Choice | Laterality: Left

## 2018-07-30 MED ORDER — OXYCODONE HCL 5 MG PO TABS
5.0000 mg | ORAL_TABLET | Freq: Once | ORAL | Status: AC | PRN
  Administered 2018-07-30: 5 mg via ORAL

## 2018-07-30 MED ORDER — SCOPOLAMINE 1 MG/3DAYS TD PT72
MEDICATED_PATCH | TRANSDERMAL | Status: DC | PRN
  Administered 2018-07-30: 1 via TRANSDERMAL

## 2018-07-30 MED ORDER — PROPOFOL 500 MG/50ML IV EMUL
INTRAVENOUS | Status: DC | PRN
  Administered 2018-07-30: 150 ug/kg/min via INTRAVENOUS

## 2018-07-30 MED ORDER — PHENYLEPHRINE 40 MCG/ML (10ML) SYRINGE FOR IV PUSH (FOR BLOOD PRESSURE SUPPORT)
PREFILLED_SYRINGE | INTRAVENOUS | Status: DC | PRN
  Administered 2018-07-30 (×2): 80 ug via INTRAVENOUS

## 2018-07-30 MED ORDER — PROPOFOL 10 MG/ML IV BOLUS
INTRAVENOUS | Status: DC | PRN
  Administered 2018-07-30: 110 mg via INTRAVENOUS
  Administered 2018-07-30 (×4): 20 mg via INTRAVENOUS

## 2018-07-30 MED ORDER — OXYCODONE-ACETAMINOPHEN 5-325 MG PO TABS
1.0000 | ORAL_TABLET | ORAL | Status: DC | PRN
  Administered 2018-07-30 – 2018-08-01 (×4): 1 via ORAL
  Filled 2018-07-30 (×5): qty 1

## 2018-07-30 MED ORDER — DEXAMETHASONE SODIUM PHOSPHATE 10 MG/ML IJ SOLN
INTRAMUSCULAR | Status: AC
  Filled 2018-07-30: qty 2

## 2018-07-30 MED ORDER — MIDAZOLAM HCL 2 MG/2ML IJ SOLN
INTRAMUSCULAR | Status: AC
  Filled 2018-07-30: qty 2

## 2018-07-30 MED ORDER — LACTATED RINGERS IV SOLN
INTRAVENOUS | Status: DC
  Administered 2018-07-30: 13:00:00 via INTRAVENOUS

## 2018-07-30 MED ORDER — OXYCODONE HCL 5 MG PO TABS
ORAL_TABLET | ORAL | Status: AC
  Filled 2018-07-30: qty 1

## 2018-07-30 MED ORDER — TOBRAMYCIN SULFATE 1.2 G IJ SOLR
INTRAMUSCULAR | Status: AC
  Filled 2018-07-30: qty 1.2

## 2018-07-30 MED ORDER — HYDROMORPHONE HCL 1 MG/ML IJ SOLN
1.0000 mg | INTRAMUSCULAR | Status: DC | PRN
  Administered 2018-07-30 – 2018-08-01 (×7): 1 mg via INTRAVENOUS
  Filled 2018-07-30 (×7): qty 1

## 2018-07-30 MED ORDER — VANCOMYCIN HCL 1000 MG IV SOLR
INTRAVENOUS | Status: AC
  Filled 2018-07-30: qty 1000

## 2018-07-30 MED ORDER — FENTANYL CITRATE (PF) 250 MCG/5ML IJ SOLN
INTRAMUSCULAR | Status: DC | PRN
  Administered 2018-07-30: 50 ug via INTRAVENOUS
  Administered 2018-07-30: 100 ug via INTRAVENOUS
  Administered 2018-07-30 (×5): 50 ug via INTRAVENOUS

## 2018-07-30 MED ORDER — ACETAMINOPHEN 160 MG/5ML PO SOLN: 1000.0000 mg | Freq: Once | ORAL | Status: DC | PRN

## 2018-07-30 MED ORDER — FENTANYL CITRATE (PF) 250 MCG/5ML IJ SOLN
INTRAMUSCULAR | Status: AC
  Filled 2018-07-30: qty 5

## 2018-07-30 MED ORDER — ROCURONIUM BROMIDE 50 MG/5ML IV SOSY
PREFILLED_SYRINGE | INTRAVENOUS | Status: AC
  Filled 2018-07-30: qty 15

## 2018-07-30 MED ORDER — DEXAMETHASONE SODIUM PHOSPHATE 10 MG/ML IJ SOLN
INTRAMUSCULAR | Status: DC | PRN
  Administered 2018-07-30: 5 mg via INTRAVENOUS

## 2018-07-30 MED ORDER — MIDAZOLAM HCL 2 MG/2ML IJ SOLN
INTRAMUSCULAR | Status: DC | PRN
  Administered 2018-07-30: 2 mg via INTRAVENOUS

## 2018-07-30 MED ORDER — FENTANYL CITRATE (PF) 100 MCG/2ML IJ SOLN
INTRAMUSCULAR | Status: AC
  Filled 2018-07-30: qty 2

## 2018-07-30 MED ORDER — ACETAMINOPHEN 10 MG/ML IV SOLN
1000.0000 mg | Freq: Once | INTRAVENOUS | Status: DC | PRN
  Administered 2018-07-30: 1000 mg via INTRAVENOUS

## 2018-07-30 MED ORDER — ONDANSETRON HCL 4 MG/2ML IJ SOLN
INTRAMUSCULAR | Status: AC
  Filled 2018-07-30: qty 4

## 2018-07-30 MED ORDER — SUGAMMADEX SODIUM 200 MG/2ML IV SOLN
INTRAVENOUS | Status: AC
  Filled 2018-07-30: qty 4

## 2018-07-30 MED ORDER — BACITRACIN ZINC 500 UNIT/GM EX OINT
TOPICAL_OINTMENT | CUTANEOUS | Status: AC
  Filled 2018-07-30: qty 28.35

## 2018-07-30 MED ORDER — KETOROLAC TROMETHAMINE 15 MG/ML IJ SOLN
15.0000 mg | Freq: Four times a day (QID) | INTRAMUSCULAR | Status: AC
  Administered 2018-07-30 – 2018-07-31 (×5): 15 mg via INTRAVENOUS
  Filled 2018-07-30 (×4): qty 1

## 2018-07-30 MED ORDER — PHENYLEPHRINE 40 MCG/ML (10ML) SYRINGE FOR IV PUSH (FOR BLOOD PRESSURE SUPPORT)
PREFILLED_SYRINGE | INTRAVENOUS | Status: AC
  Filled 2018-07-30: qty 20

## 2018-07-30 MED ORDER — FENTANYL CITRATE (PF) 100 MCG/2ML IJ SOLN
25.0000 ug | INTRAMUSCULAR | Status: DC | PRN
  Administered 2018-07-30 (×2): 25 ug via INTRAVENOUS

## 2018-07-30 MED ORDER — SUGAMMADEX SODIUM 200 MG/2ML IV SOLN
INTRAVENOUS | Status: DC | PRN
  Administered 2018-07-30: 200 mg via INTRAVENOUS

## 2018-07-30 MED ORDER — LIDOCAINE 2% (20 MG/ML) 5 ML SYRINGE
INTRAMUSCULAR | Status: AC
  Filled 2018-07-30: qty 5

## 2018-07-30 MED ORDER — KETOROLAC TROMETHAMINE 15 MG/ML IJ SOLN
INTRAMUSCULAR | Status: AC
  Filled 2018-07-30: qty 1

## 2018-07-30 MED ORDER — ROCURONIUM BROMIDE 10 MG/ML (PF) SYRINGE
PREFILLED_SYRINGE | INTRAVENOUS | Status: DC | PRN
  Administered 2018-07-30: 20 mg via INTRAVENOUS
  Administered 2018-07-30: 50 mg via INTRAVENOUS

## 2018-07-30 MED ORDER — 0.9 % SODIUM CHLORIDE (POUR BTL) OPTIME
TOPICAL | Status: DC | PRN
  Administered 2018-07-30: 1000 mL

## 2018-07-30 MED ORDER — CEFAZOLIN SODIUM-DEXTROSE 2-4 GM/100ML-% IV SOLN
2.0000 g | Freq: Three times a day (TID) | INTRAVENOUS | Status: AC
  Administered 2018-07-30 – 2018-07-31 (×3): 2 g via INTRAVENOUS
  Filled 2018-07-30 (×3): qty 100

## 2018-07-30 MED ORDER — ACETAMINOPHEN 10 MG/ML IV SOLN
INTRAVENOUS | Status: AC
  Filled 2018-07-30: qty 100

## 2018-07-30 MED ORDER — OXYCODONE HCL 5 MG/5ML PO SOLN: 5.0000 mg | Freq: Once | ORAL | Status: AC | PRN

## 2018-07-30 MED ORDER — LIDOCAINE 2% (20 MG/ML) 5 ML SYRINGE
INTRAMUSCULAR | Status: DC | PRN
  Administered 2018-07-30: 60 mg via INTRAVENOUS

## 2018-07-30 MED ORDER — VANCOMYCIN HCL 1000 MG IV SOLR
INTRAVENOUS | Status: DC | PRN
  Administered 2018-07-30: 1000 mg via TOPICAL

## 2018-07-30 MED ORDER — ACETAMINOPHEN 500 MG PO TABS: 1000.0000 mg | ORAL_TABLET | Freq: Once | ORAL | Status: DC | PRN

## 2018-07-30 SURGICAL SUPPLY — 83 items
BANDAGE ACE 4X5 VEL STRL LF (GAUZE/BANDAGES/DRESSINGS) ×3 IMPLANT
BANDAGE ACE 6X5 VEL STRL LF (GAUZE/BANDAGES/DRESSINGS) ×3 IMPLANT
BANDAGE ELASTIC 4 VELCRO ST LF (GAUZE/BANDAGES/DRESSINGS) ×3 IMPLANT
BANDAGE ELASTIC 6 VELCRO ST LF (GAUZE/BANDAGES/DRESSINGS) ×3 IMPLANT
BANDAGE ESMARK 6X9 LF (GAUZE/BANDAGES/DRESSINGS) ×1 IMPLANT
BIT DRILL 2.5 X LONG (BIT) ×1
BIT DRILL CALIBR QC 2.8X250 (BIT) ×3 IMPLANT
BIT DRILL QC 3.5X110 (BIT) ×3 IMPLANT
BIT DRILL X LONG 2.5 (BIT) ×1 IMPLANT
BLADE CLIPPER SURG (BLADE) IMPLANT
BLADE SURG 15 STRL LF DISP TIS (BLADE) ×1 IMPLANT
BLADE SURG 15 STRL SS (BLADE) ×2
BNDG ESMARK 6X9 LF (GAUZE/BANDAGES/DRESSINGS) ×3
BNDG GAUZE ELAST 4 BULKY (GAUZE/BANDAGES/DRESSINGS) ×3 IMPLANT
BONE CANC CHIPS 20CC PCAN1/4 (Bone Implant) ×3 IMPLANT
BRUSH SCRUB SURG 4.25 DISP (MISCELLANEOUS) ×6 IMPLANT
CANISTER SUCT 3000ML PPV (MISCELLANEOUS) ×3 IMPLANT
CHIPS CANC BONE 20CC PCAN1/4 (Bone Implant) ×1 IMPLANT
CHLORAPREP W/TINT 26ML (MISCELLANEOUS) ×6 IMPLANT
COVER SURGICAL LIGHT HANDLE (MISCELLANEOUS) ×3 IMPLANT
COVER WAND RF STERILE (DRAPES) ×3 IMPLANT
CUFF TOURNIQUET SINGLE 34IN LL (TOURNIQUET CUFF) ×3 IMPLANT
DRAPE C-ARM 42X72 X-RAY (DRAPES) ×3 IMPLANT
DRAPE C-ARMOR (DRAPES) ×3 IMPLANT
DRAPE ORTHO SPLIT 77X108 STRL (DRAPES) ×4
DRAPE SURG ORHT 6 SPLT 77X108 (DRAPES) ×2 IMPLANT
DRAPE U-SHAPE 47X51 STRL (DRAPES) ×3 IMPLANT
DRILL BIT X LONG 2.5 (BIT) ×2
DRSG ADAPTIC 3X8 NADH LF (GAUZE/BANDAGES/DRESSINGS) ×3 IMPLANT
DRSG PAD ABDOMINAL 8X10 ST (GAUZE/BANDAGES/DRESSINGS) ×6 IMPLANT
ELECT REM PT RETURN 9FT ADLT (ELECTROSURGICAL) ×3
ELECTRODE REM PT RTRN 9FT ADLT (ELECTROSURGICAL) ×1 IMPLANT
GAUZE SPONGE 4X4 12PLY STRL (GAUZE/BANDAGES/DRESSINGS) ×3 IMPLANT
GAUZE SPONGE 4X4 12PLY STRL LF (GAUZE/BANDAGES/DRESSINGS) ×3 IMPLANT
GLOVE BIO SURGEON STRL SZ7.5 (GLOVE) ×12 IMPLANT
GLOVE BIOGEL PI IND STRL 7.5 (GLOVE) ×1 IMPLANT
GLOVE BIOGEL PI INDICATOR 7.5 (GLOVE) ×2
GLOVE SURG SS PI 7.0 STRL IVOR (GLOVE) ×3 IMPLANT
GOWN STRL REUS W/ TWL LRG LVL3 (GOWN DISPOSABLE) ×2 IMPLANT
GOWN STRL REUS W/TWL LRG LVL3 (GOWN DISPOSABLE) ×4
IMMOBILIZER KNEE 22 UNIV (SOFTGOODS) ×3 IMPLANT
K-WIRE 1.6X150 (WIRE) ×3
KIT BASIN OR (CUSTOM PROCEDURE TRAY) ×3 IMPLANT
KIT TURNOVER KIT B (KITS) ×3 IMPLANT
KWIRE 1.6X150 (WIRE) ×1 IMPLANT
NDL SUT 6 .5 CRC .975X.05 MAYO (NEEDLE) ×1 IMPLANT
NEEDLE MAYO TAPER (NEEDLE) ×2
NS IRRIG 1000ML POUR BTL (IV SOLUTION) ×3 IMPLANT
PACK TOTAL JOINT (CUSTOM PROCEDURE TRAY) ×3 IMPLANT
PAD ABD 8X10 STRL (GAUZE/BANDAGES/DRESSINGS) ×6 IMPLANT
PAD ARMBOARD 7.5X6 YLW CONV (MISCELLANEOUS) ×6 IMPLANT
PAD CAST 4YDX4 CTTN HI CHSV (CAST SUPPLIES) ×1 IMPLANT
PADDING CAST COTTON 4X4 STRL (CAST SUPPLIES) ×2
PADDING CAST COTTON 6X4 STRL (CAST SUPPLIES) ×3 IMPLANT
PLATE PROX TIB LT 3.5 VA-LCP (Plate) ×3 IMPLANT
SCREW CORT HEADED ST 3.5X32 (Screw) ×3 IMPLANT
SCREW HEADED ST 3.5X38 (Screw) ×3 IMPLANT
SCREW HEADED ST 3.5X42 (Screw) ×3 IMPLANT
SCREW HEADED ST 3.5X65 (Screw) ×3 IMPLANT
SCREW HEADED ST 3.5X68 (Screw) ×3 IMPLANT
SCREW HEADED ST 3.5X70 (Screw) ×3 IMPLANT
SCREW LOCKING 3.5X70MM VA (Screw) ×6 IMPLANT
STAPLER VISISTAT 35W (STAPLE) ×3 IMPLANT
SUCTION FRAZIER HANDLE 10FR (MISCELLANEOUS) ×2
SUCTION TUBE FRAZIER 10FR DISP (MISCELLANEOUS) ×1 IMPLANT
SUT ETHILON 2 0 FS 18 (SUTURE) ×3 IMPLANT
SUT ETHILON 3 0 PS 1 (SUTURE) IMPLANT
SUT FIBERWIRE #2 38 T-5 BLUE (SUTURE) ×3
SUT VIC AB 0 CT1 18XCR BRD 8 (SUTURE) ×1 IMPLANT
SUT VIC AB 0 CT1 27 (SUTURE) ×2
SUT VIC AB 0 CT1 27XBRD ANBCTR (SUTURE) ×1 IMPLANT
SUT VIC AB 0 CT1 8-18 (SUTURE) ×2
SUT VIC AB 1 CT1 18XCR BRD 8 (SUTURE) IMPLANT
SUT VIC AB 1 CT1 27 (SUTURE) ×2
SUT VIC AB 1 CT1 27XBRD ANBCTR (SUTURE) ×1 IMPLANT
SUT VIC AB 1 CT1 8-18 (SUTURE)
SUT VIC AB 2-0 CT1 27 (SUTURE) ×4
SUT VIC AB 2-0 CT1 TAPERPNT 27 (SUTURE) ×2 IMPLANT
SUTURE FIBERWR #2 38 T-5 BLUE (SUTURE) ×1 IMPLANT
SYR 5ML LL (SYRINGE) ×3 IMPLANT
TOWEL OR 17X26 10 PK STRL BLUE (TOWEL DISPOSABLE) ×6 IMPLANT
TRAY FOLEY MTR SLVR 16FR STAT (SET/KITS/TRAYS/PACK) IMPLANT
WATER STERILE IRR 1000ML POUR (IV SOLUTION) ×6 IMPLANT
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
## 2018-07-30 NOTE — Anesthesia Postprocedure Evaluation (Signed)
Anesthesia Post Note  Patient: [name]  Procedure(s) Performed: OPEN REDUCTION INTERNAL FIXATION (ORIF) TIBIAL PLATEAU (Left )     Patient location during evaluation: PACU Anesthesia Type: General Level of consciousness: awake and alert Pain management: pain level controlled Vital Signs Assessment: post-procedure vital signs reviewed and stable Respiratory status: spontaneous breathing, nonlabored ventilation and respiratory function stable Cardiovascular status: blood pressure returned to baseline and stable Postop Assessment: no apparent nausea or vomiting Anesthetic complications: no    Last Vitals:  Vitals:   07/30/18 1643 07/30/18 1650  BP:  122/76  Pulse: 71 93  Resp: 12 15  Temp:  (!) 36.1 C  SpO2: 100% 100%    Last Pain:  Vitals:   07/30/18 1702  TempSrc:   PainSc: [place]

## 2018-07-30 NOTE — H&P (Signed)
Please see orthopaedic consult for full H&P.  Shona Needles, MD [facility] [phone] (phone)

## 2018-07-30 NOTE — Anesthesia Procedure Notes (Signed)
Procedure Name: Intubation Date/Time: 07/30/2018 1:51 PM Performed by: White, Amedeo Plenty, CRNA Pre-anesthesia Checklist: Patient identified, Emergency Drugs available, Suction available and Patient being monitored Patient Re-evaluated:Patient Re-evaluated prior to induction Oxygen Delivery Method: Circle System Utilized Preoxygenation: Pre-oxygenation with 100% oxygen Induction Type: IV induction Ventilation: Mask ventilation without difficulty Laryngoscope Size: Mac and 3 Grade View: Grade I Tube type: Oral Tube size: 7.0 mm Number of attempts: 1 Airway Equipment and Method: Stylet and Oral airway Placement Confirmation: ETT inserted through vocal cords under direct vision,  positive ETCO2 and breath sounds checked- equal and bilateral Secured at: 22 cm Tube secured with: Tape Dental Injury: Teeth and Oropharynx as per pre-operative assessment

## 2018-07-30 NOTE — Transfer of Care (Signed)
Immediate Anesthesia Transfer of Care Note  Patient: [name]  Procedure(s) Performed: OPEN REDUCTION INTERNAL FIXATION (ORIF) TIBIAL PLATEAU (Left )  Patient Location: PACU  Anesthesia Type:General  Level of Consciousness: drowsy and patient cooperative  Airway & Oxygen Therapy: Patient Spontanous Breathing and Patient connected to nasal cannula oxygen  Post-op Assessment: Report given to RN and Post -op Vital signs reviewed and stable  Post vital signs: Reviewed and stable  Last Vitals:  Vitals Value Taken Time  BP 128/86 07/30/2018  3:49 PM  Temp    Pulse 92 07/30/2018  3:50 PM  Resp 7 07/30/2018  3:50 PM  SpO2 100 % 07/30/2018  3:50 PM  Vitals shown include unvalidated device data.  Last Pain:  Vitals:   07/30/18 1220  TempSrc: Oral  PainSc:       Patients Stated Pain Goal: (Pt states anything below 5) (45/[id number] 4142)  Complications: No apparent anesthesia complications

## 2018-07-30 NOTE — Anesthesia Preprocedure Evaluation (Signed)
Anesthesia Evaluation  Patient identified by MRN, date of birth, ID band Patient awake    Reviewed: Allergy & Precautions, NPO status , Patient's Chart, lab work & pertinent test results  History of Anesthesia Complications Negative for: history of anesthetic complications  Airway Mallampati: I  TM Distance: >3 FB Neck ROM: Full    Dental  (+) Teeth Intact   Pulmonary neg pulmonary ROS,    breath sounds clear to auscultation       Cardiovascular negative cardio ROS   Rhythm:Regular     Neuro/Psych PSYCHIATRIC DISORDERS Anxiety Depression negative neurological ROS     GI/Hepatic Neg liver ROS, GERD  Medicated and Controlled,  Endo/Other    Renal/GU negative Renal ROS     Musculoskeletal  Left tibia plateau fx   Abdominal   Peds  Hematology negative hematology ROS (+)   Anesthesia Other Findings   Reproductive/Obstetrics                             Anesthesia Physical Anesthesia Plan  ASA: II  Anesthesia Plan: General   Post-op Pain Management:    Induction: Intravenous  PONV Risk Score and Plan: 3 and Ondansetron, Dexamethasone, Propofol infusion, TIVA, Scopolamine patch - Pre-op and Midazolam  Airway Management Planned: Oral ETT  Additional Equipment: None  Intra-op Plan:   Post-operative Plan: Extubation in OR  Informed Consent: I have reviewed the patients History and Physical, chart, labs and discussed the procedure including the risks, benefits and alternatives for the proposed anesthesia with the patient or authorized representative who has indicated his/her understanding and acceptance.   Dental advisory given  Plan Discussed with: CRNA and Surgeon  Anesthesia Plan Comments:         Anesthesia Quick Evaluation

## 2018-07-31 ENCOUNTER — Encounter (HOSPITAL_COMMUNITY): Payer: Self-pay | Admitting: Student

## 2018-07-31 NOTE — Progress Notes (Signed)
07/31/18 1751  PT Visit Information  Last PT Received On 07/31/18  Assistance Needed +1  History of Present Illness Pt is a 37 y/o female s/p Open reduction internal fixation of left tibial plateau fracture, and Repair of lateral meniscus.   Precautions  Precautions Other (comment)  Precaution Comments Unrestricted ROM; hinge brace unlocked during day and locked in extension at night  Required Braces or Orthoses Other Brace/Splint  Other Brace/Splint hinged knee brace  Restrictions  Weight Bearing Restrictions Yes  LLE Weight Bearing NWB  Home Living  Family/patient expects to be discharged to: Private residence  Living Arrangements Spouse/significant other;Children (13, 10)  Available Help at Discharge Family;Available 24 hours/day  Type of Home House  Home Access Stairs to enter  Entrance Stairs-Number of Steps 4  Entrance Stairs-Rails None  Home Layout One level  Bathroom Therapist, music None  Prior Function  Level of Independence Independent  Comments RN, 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  Communication  Communication No difficulties  Pain Assessment  Pain Assessment 0-10  Pain Score 9  Pain Location LLE  Pain Descriptors / Indicators Throbbing;Grimacing  Pain Intervention(s) Limited activity within patient's tolerance;Monitored during session;Repositioned  Cognition  Arousal/Alertness Awake/alert  Behavior During Therapy WFL for tasks assessed/performed  Overall Cognitive Status Within Functional Limits for tasks assessed  Upper Extremity Assessment  Upper Extremity Assessment Defer to OT evaluation  Lower Extremity Assessment  Lower Extremity Assessment LLE deficits/detail  LLE Deficits / Details Deficits consistent with post op pain and weakness. Pt in hinge brace throughout   Cervical / Trunk Assessment  Cervical / Trunk Assessment Normal  Bed Mobility  Overal bed mobility Needs Assistance  Bed Mobility Supine to Sit   Supine to sit Min assist;HOB elevated  General bed mobility comments min A to assist with LLE to EOB and lowering to the ground, Pt able to complete all other bed mobility without assist.   Transfers  Overall transfer level Needs assistance  Equipment used Rolling walker (2 wheeled)  Transfers Sit to/from Stand  Sit to Stand Min guard;Min assist  General transfer comment Min to min guard for steadying assist to come to standing. Cues for safe hand placement.   Ambulation/Gait  Ambulation/Gait assistance Min guard  Gait Distance (Feet) 15 Feet  Assistive device Rolling walker (2 wheeled)  Gait Pattern/deviations Step-to pattern  General Gait Details Hop to gait pattern. OVerall steady with use of RW and able to maintain NWB on LLE. Pt with good technique with use of RW. Gait limited to within the room secondary to pain.   Gait velocity Decreased   Balance  Overall balance assessment Needs assistance  Sitting-balance support Feet supported;No upper extremity supported  Sitting balance-Leahy Scale Good  Standing balance support Bilateral upper extremity supported  Standing balance-Leahy Scale Poor  Standing balance comment dependent on BUE support  General Comments  General comments (skin integrity, edema, etc.) Husband present throughout session   PT - End of Session  Equipment Utilized During Treatment Gait belt;Other (comment) (hinge brace)  Activity Tolerance Patient limited by pain  Patient left in chair;with call bell/phone within reach;with family/visitor present  Nurse Communication Mobility status  PT Assessment  PT Recommendation/Assessment Patient needs continued PT services  PT Visit Diagnosis Other abnormalities of gait and mobility (R26.89);Pain  Pain - Right/Left Left  Pain - part of body Leg  PT Problem List Decreased mobility;Decreased activity tolerance;Decreased knowledge of use of DME;Pain  PT Plan  PT Frequency (ACUTE ONLY) Min 5X/week  PT  Treatment/Interventions (ACUTE ONLY) DME instruction;Gait training;Stair training;Therapeutic activities;Functional mobility training;Therapeutic exercise;Balance training;Patient/family education  AM-PAC PT "6 Clicks" Daily Activity Outcome Measure  Difficulty turning over in bed (including adjusting bedclothes, sheets and blankets)? 3  Difficulty moving from lying on back to sitting on the side of the bed?  1  Difficulty sitting down on and standing up from a chair with arms (e.g., wheelchair, bedside commode, etc,.)? 1  Help needed moving to and from a bed to chair (including a wheelchair)? 3  Help needed walking in hospital room? 3  Help needed climbing 3-5 steps with a railing?  2  6 Click Score 13  Mobility G Code  CK  PT Recommendation  Follow Up Recommendations Supervision for mobility/OOB;Other (comment) (would benefit from outpatient PT once cleared by MD )  PT equipment Rolling walker with 5" wheels  Individuals Consulted  Consulted and Agree with Results and Recommendations Patient;Family member/caregiver  Family Member Consulted husband  Acute Rehab PT Goals  Patient Stated Goal to heal correctly and get full ROM in knee  PT Goal Formulation With patient  Time For Goal Achievement 08/14/18  Potential to Achieve Goals Good  PT Time Calculation  PT Start Time (ACUTE ONLY) 1551  PT Stop Time (ACUTE ONLY) 1612  PT Time Calculation (min) (ACUTE ONLY) 21 min  PT General Charges  $$ ACUTE PT VISIT 1 Visit  PT Evaluation  $PT Eval Low Complexity 1 Low  Written Expression  Dominant Hand Right    Pt s/p surgery above with deficits above. Pt limited in gait tolerance secondary to pain, however, was overall steady with use of RW for gait within the room. Required min guard A with use of RW and able to maintain NWB on LLE. Will determine most appropriate AD during next session (crutches vs RW), however, pt reports comfort with use of RW. Anticipate pt will progress well. Will likely  need outpatient PT services once cleared by MD. Will continue to follow acutely to maximize functional mobility independence and safety.   Leighton Ruff, PT, DPT  Acute Rehabilitation Services  Pager: [phone] Office: [phone]

## 2018-07-31 NOTE — Progress Notes (Signed)
Orthopedic Tech Progress Note Patient Details:  [name] [facility] 09/14/1981 [id number]  Ortho Devices Type of Ortho Device: Crutches Ortho Device/Splint Location: lle Ortho Device/Splint Interventions: Ordered, Application, Adjustment   Post Interventions Patient Tolerated: Well Instructions Provided: Care of device, Adjustment of device   [name] 07/31/2018, 4:50 PM

## 2018-07-31 NOTE — Evaluation (Signed)
Occupational Therapy Evaluation Patient Details Name: [name] MRN: [id number] DOB: 05/29/1981 Today's Date: 07/31/2018    History of Present Illness Pt is a 37 y/o female s/p Open reduction internal fixation of left tibial plateau fracture, and Repair of lateral meniscus.    Clinical Impression   PTA Pt independent in ADL/IADL and mobility. Pt is currently min guard assist for transfers with RW, She was able to bathe herself at the sink with set up, just needing min guard assist for standing while her husband assisted her rear peri area. She will benefit from skilled OT in the acute setting to maximize safety and independence in ADL and functional transfers. Next session, please take AE kit for demo and tub transfer with tub bench.    Follow Up Recommendations  No OT follow up;Supervision/Assistance - 24 hour(initially)    Equipment Recommendations  3 in 1 bedside commode;Tub/shower bench    Recommendations for Other Services       Precautions / Restrictions Precautions Required Braces or Orthoses: Other Brace/Splint(hinged knee brace) Other Brace/Splint: hinged knee brace Restrictions Weight Bearing Restrictions: Yes LLE Weight Bearing: Non weight bearing      Mobility Bed Mobility Overal bed mobility: Needs Assistance Bed Mobility: Supine to Sit     Supine to sit: Min assist;HOB elevated     General bed mobility comments: min A to assist with LLE to EOB and lowering to the ground, Pt able to complete all other bed mobility  Transfers Overall transfer level: Needs assistance Equipment used: Rolling walker (2 wheeled) Transfers: Sit to/from Stand Sit to Stand: Min guard;Min assist         General transfer comment: min guard assist for safety and balance     Balance Overall balance assessment: Needs assistance Sitting-balance support: Feet supported;No upper extremity supported Sitting balance-Leahy Scale: Good     Standing balance support: Bilateral  upper extremity supported Standing balance-Leahy Scale: Poor Standing balance comment: dependent on BUE support                           ADL either performed or assessed with clinical judgement   ADL Overall ADL's : Needs assistance/impaired Eating/Feeding: Independent   Grooming: Modified independent;Sitting;Oral care Grooming Details (indicate cue type and reason): sitting at sink Upper Body Bathing: Set up;Sitting Upper Body Bathing Details (indicate cue type and reason): set up at sink Lower Body Bathing: Minimal assistance;Sit to/from stand Lower Body Bathing Details (indicate cue type and reason): min A for steadying Upper Body Dressing : Modified independent;Sitting   Lower Body Dressing: Maximal assistance;Sit to/from stand Lower Body Dressing Details (indicate cue type and reason): Pt needs assist with LLE, able to bring foot cross for RLE Toilet Transfer: Min guard;Minimal assistance;Ambulation;RW Toilet Transfer Details (indicate cue type and reason): able to maintain WB status Toileting- Clothing Manipulation and Hygiene: Set up;Sitting/lateral lean       Functional mobility during ADLs: Min guard;Minimal assistance;Rolling walker       Vision         Perception     Praxis      Pertinent Vitals/Pain Pain Assessment: 0-10 Pain Score: 9  Pain Location: LLE Pain Descriptors / Indicators: Throbbing;Grimacing Pain Intervention(s): Monitored during session;Repositioned;Patient requesting pain meds-RN notified     Hand Dominance Right   Extremity/Trunk Assessment Upper Extremity Assessment Upper Extremity Assessment: Overall WFL for tasks assessed   Lower Extremity Assessment Lower Extremity Assessment: Defer to PT evaluation  Cervical / Trunk Assessment Cervical / Trunk Assessment: Normal   Communication Communication Communication: No difficulties   Cognition Arousal/Alertness: Awake/alert Behavior During Therapy: WFL for tasks  assessed/performed Overall Cognitive Status: Within Functional Limits for tasks assessed                                     General Comments  husband present throughout    Exercises     Shoulder Instructions      Home Living Family/patient expects to be discharged to:: Private residence Living Arrangements: Spouse/significant other;Children(13, 10) Available Help at Discharge: Family;Available 24 hours/day Type of Home: House Home Access: Stairs to enter [employer] of Steps: 4 Entrance Stairs-Rails: None Home Layout: One level     Bathroom Shower/Tub: Teacher, early years/pre: Standard     Home Equipment: None          Prior Functioning/Environment Level of Independence: Independent        Comments: Therapist, sports, 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        OT Problem List: Decreased range of motion;Decreased activity tolerance;Impaired balance (sitting and/or standing);Decreased knowledge of use of DME or AE;Decreased knowledge of precautions;Pain      OT Treatment/Interventions: Self-care/ADL training;DME and/or AE instruction;Patient/family education;Balance training;Therapeutic activities    OT Goals(Current goals can be found in the care plan section) Acute Rehab OT Goals Patient Stated Goal: to heal correctly and get full ROM in knee OT Goal Formulation: With patient Time For Goal Achievement: 08/14/18 Potential to Achieve Goals: Good ADL Goals Pt Will Perform Tub/Shower Transfer: Tub transfer;tub bench;rolling walker;with supervision;with caregiver independent in assisting Additional ADL Goal #1: Pt will demonstrate ability to use AE to access LB for ADL at independent level  OT Frequency: Min 2X/week   Barriers to D/C:            Co-evaluation              AM-PAC PT "6 Clicks" Daily Activity     Outcome Measure Help from another person eating meals?: None Help from another person taking care of personal grooming?: None(in  sitting) Help from another person toileting, which includes using toliet, bedpan, or urinal?: A Little Help from another person bathing (including washing, rinsing, drying)?: A Little Help from another person to put on and taking off regular upper body clothing?: None Help from another person to put on and taking off regular lower body clothing?: A Lot 6 Click Score: 20   End of Session Equipment Utilized During Treatment: Gait belt;Rolling walker;Other (comment)(hinged knee brace) Nurse Communication: Mobility status;Weight bearing status;Patient requests pain meds  Activity Tolerance: Patient tolerated treatment well Patient left: Other (comment)(working with PT)  OT Visit Diagnosis: Unsteadiness on feet (R26.81);Other abnormalities of gait and mobility (R26.89);Pain Pain - Right/Left: Left Pain - part of body: Leg                Time: 1530-1551 OT Time Calculation (min): 21 min Charges:  OT General Charges $OT Visit: 1 Visit OT Evaluation $OT Eval Moderate Complexity: Florin OTR/L Acute Rehabilitation Services Pager: [phone] Office: [place] 07/31/2018, 5:39 PM

## 2018-07-31 NOTE — Progress Notes (Signed)
Patient requested to keep foley until after physical therapy today.

## 2018-07-31 NOTE — Plan of Care (Signed)
  Problem: Education: Goal: Knowledge of General Education information will improve Description Including pain rating scale, medication(s)/side effects and non-pharmacologic comfort measures Outcome: Progressing   Problem: Clinical Measurements: Goal: Ability to maintain clinical measurements within normal limits will improve Outcome: Progressing Goal: Will remain free from infection Outcome: Progressing   Problem: Activity: Goal: Risk for activity intolerance will decrease Outcome: Progressing   Problem: Nutrition: Goal: Adequate nutrition will be maintained Outcome: Progressing   Problem: Elimination: Goal: Will not experience complications related to bowel motility Outcome: Progressing Goal: Will not experience complications related to urinary retention Outcome: Progressing   Problem: Pain Managment: Goal: General experience of comfort will improve Outcome: Progressing   Problem: Safety: Goal: Ability to remain free from injury will improve Outcome: Progressing   Problem: Skin Integrity: Goal: Risk for impaired skin integrity will decrease Outcome: Progressing
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
## 2018-07-31 NOTE — Care Management Note (Addendum)
Case Management Note  Patient Details  Name: DOTTI BUSEY MRN: [id number] Date of Birth: May 26, 1981  Subjective/Objective:     Left Schatzker II tibial plateau fracture s/p ORIF               Action/Plan: NCM spoke to pt at bedside. Pt states husband and family will be at home to support her as needed. States her mother and mother in law will be there during the day to assist. Faxed DME orders to [employer], [place], cell [phone], [phone], fax [phone]. They will deliver RW, bc, and shower chair to home. Contacted Ortho for crutches.   Expected Discharge Date:                  Expected Discharge Plan:  Home/Self Care  In-House Referral:  NA  Discharge planning Services  CM Consult  Post Acute Care Choice:  NA Choice offered to:  NA  DME Arranged:  3-N-1, Walker rolling, Crutches, Patient refused services, Tub bench DME Agency:  Other - Comment  HH Arranged:  NA HH Agency:  NA  Status of Service:  Completed, signed off  If discussed at [place] of Stay Meetings, dates discussed:    Additional Comments:  Erenest Rasher, RN 07/31/2018, 3:29 PM

## 2018-07-31 NOTE — Progress Notes (Signed)
Patient voided, ambulated back to bed x1 assist, and hinge brace currently in locked position.

## 2018-07-31 NOTE — Progress Notes (Signed)
Orthopedic Tech Progress Note Patient Details:  [name] [facility] [birth year] [id number]  Patient ID: [name], female   DOB: 09-12-1981, 37 y.o.   MRN: [id number] Called brace order into bio tech.  [name] 07/31/2018, 9:26 AM

## 2018-08-01 ENCOUNTER — Encounter (HOSPITAL_COMMUNITY): Payer: Self-pay | Admitting: Orthopedic Surgery

## 2018-08-01 DIAGNOSIS — S83282A Other tear of lateral meniscus, current injury, left knee, initial encounter: Secondary | ICD-10-CM | POA: Diagnosis present

## 2018-08-01 DIAGNOSIS — S82122A Displaced fracture of lateral condyle of left tibia, initial encounter for closed fracture: Secondary | ICD-10-CM | POA: Diagnosis present

## 2018-08-01 DIAGNOSIS — K219 Gastro-esophageal reflux disease without esophagitis: Secondary | ICD-10-CM | POA: Diagnosis present

## 2018-08-01 HISTORY — DX: Displaced fracture of lateral condyle of left tibia, initial encounter for closed fracture: S82.122A

## 2018-08-01 MED ORDER — OXYCODONE-ACETAMINOPHEN 5-325 MG PO TABS: 1.0000 | ORAL_TABLET | ORAL | 0 refills | Status: DC | PRN

## 2018-08-01 MED ORDER — ASPIRIN EC 325 MG PO TBEC: 325.0000 mg | DELAYED_RELEASE_TABLET | Freq: Every day | ORAL | 0 refills | Status: DC

## 2018-08-01 MED ORDER — METHOCARBAMOL 500 MG PO TABS: 500.0000 mg | ORAL_TABLET | Freq: Three times a day (TID) | ORAL | 0 refills | Status: DC | PRN

## 2018-08-01 MED ORDER — TRAMADOL HCL 50 MG PO TABS
50.0000 mg | ORAL_TABLET | Freq: Four times a day (QID) | ORAL | Status: DC | PRN
  Administered 2018-08-01: 50 mg via ORAL
  Filled 2018-08-01: qty 1

## 2018-08-01 MED ORDER — DOCUSATE SODIUM 100 MG PO CAPS: 100.0000 mg | ORAL_CAPSULE | Freq: Every day | ORAL | 2 refills | Status: DC | PRN
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
## 2018-08-01 NOTE — Discharge Instructions (Signed)
Orthopaedic Trauma Service Discharge Instructions   General Discharge Instructions  WEIGHT BEARING STATUS: Nonweightbearing left leg  RANGE OF MOTION/ACTIVITY: Unrestricted range of motion left knee.  Hinged knee brace need to be locked in full extension at night (fully straight).  Unlocked during the day to allow for full range of motion. Continue home exercise program that was taught to you by physical therapy.  Do not let pillows rest under the bend of the knee.  Place him under the ankle to elevate your leg and to keep your knee at full extension when not working on range of motion exercises  Wound Care: Daily wound care starting on 08/03/2018.  Please see below.  Once all wounds are dry and without drainage it is okay to leave wounds open to the air and to shower and clean with soap and water only.  Do not apply any ointments lotions or solutions to the wound.  Okay to use TED hose for swelling control once your pain allows you to get them on.  DVT/PE prophylaxis: Aspirin 325 mg daily for the next 4 weeks  Diet: as you were eating previously.  Can use over the counter stool softeners and bowel preparations, such as Miralax, to help with bowel movements.  Narcotics can be constipating.  Be sure to drink plenty of fluids  PAIN MEDICATION USE AND EXPECTATIONS  You have likely been given narcotic medications to help control your pain.  After a traumatic event that results in an fracture (broken bone) with or without surgery, it is ok to use narcotic pain medications to help control one's pain.  We understand that everyone responds to pain differently and each individual patient will be evaluated on a regular basis for the continued need for narcotic medications. Ideally, narcotic medication use should last no more than 6-8 weeks (coinciding with fracture healing).   As a patient it is your responsibility as well to monitor narcotic medication use and report the amount and frequency you use  these medications when you come to your office visit.   We would also advise that if you are using narcotic medications, you should take a dose prior to therapy to maximize you participation.  IF YOU ARE ON NARCOTIC MEDICATIONS IT IS NOT PERMISSIBLE TO OPERATE A MOTOR VEHICLE (MOTORCYCLE/CAR/TRUCK/MOPED) OR HEAVY MACHINERY DO NOT MIX NARCOTICS WITH OTHER CNS (CENTRAL NERVOUS SYSTEM) DEPRESSANTS SUCH AS ALCOHOL   STOP SMOKING OR USING NICOTINE PRODUCTS!!!!  As discussed nicotine severely impairs your body's ability to heal surgical and traumatic wounds but also impairs bone healing.  Wounds and bone heal by forming microscopic blood vessels (angiogenesis) and nicotine is a vasoconstrictor (essentially, shrinks blood vessels).  Therefore, if vasoconstriction occurs to these microscopic blood vessels they essentially disappear and are unable to deliver necessary nutrients to the healing tissue.  This is one modifiable factor that you can do to dramatically increase your chances of healing your injury.    (This means no smoking, no nicotine gum, patches, etc)  DO NOT USE NONSTEROIDAL ANTI-INFLAMMATORY DRUGS (NSAID'S)  Using products such as Advil (ibuprofen), Aleve (naproxen), Motrin (ibuprofen) for additional pain control during fracture healing can delay and/or prevent the healing response.  If you would like to take over the counter (OTC) medication, Tylenol (acetaminophen) is ok.  However, some narcotic medications that are given for pain control contain acetaminophen as well. Therefore, you should not exceed more than 4000 mg of tylenol in a day if you do not have liver disease.  Also note that there are may OTC medicines, such as cold medicines and allergy medicines that my contain tylenol as well.  If you have any questions about medications and/or interactions please ask your doctor/PA or your pharmacist.      ICE AND ELEVATE INJURED/OPERATIVE EXTREMITY  Using ice and elevating the injured  extremity above your heart can help with swelling and pain control.  Icing in a pulsatile fashion, such as 20 minutes on and 20 minutes off, can be followed.    Do not place ice directly on skin. Make sure there is a barrier between to skin and the ice pack.    Using frozen items such as frozen peas works well as the conform nicely to the are that needs to be iced.  USE AN ACE WRAP OR TED HOSE FOR SWELLING CONTROL  In addition to icing and elevation, Ace wraps or TED hose are used to help limit and resolve swelling.  It is recommended to use Ace wraps or TED hose until you are informed to stop.    When using Ace Wraps start the wrapping distally (farthest away from the body) and wrap proximally (closer to the body)   Example: If you had surgery on your leg or thing and you do not have a splint on, start the ace wrap at the toes and work your way up to the thigh        If you had surgery on your upper extremity and do not have a splint on, start the ace wrap at your fingers and work your way up to the upper arm  IF YOU ARE IN A SPLINT OR CAST DO NOT [place]   If your splint gets wet for any reason please contact the office immediately. You may shower in your splint or cast as long as you keep it dry.  This can be done by wrapping in a cast cover or garbage back (or similar)  Do Not stick any thing down your splint or cast such as pencils, money, or hangers to try and scratch yourself with.  If you feel itchy take benadryl as prescribed on the bottle for itching  IF YOU ARE IN A CAM BOOT (BLACK BOOT)  You may remove boot periodically. Perform daily dressing changes as noted below.  Wash the liner of the boot regularly and wear a sock when wearing the boot. It is recommended that you sleep in the boot until told otherwise  CALL THE OFFICE WITH ANY QUESTIONS OR CONCERNS: [phone]
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
## 2018-08-01 NOTE — Progress Notes (Signed)
Nsg Discharge Note  Admit Date:  07/29/2018 Discharge date: 08/01/2018   [name] RCVEL to be D/C'd Home per MD order.  AVS completed.  Copy for chart, and copy for patient signed, and dated. Patient/caregiver able to verbalize understanding.  Discharge Medication: Allergies as of 08/01/2018      Reactions   Codeine Nausea And Vomiting   Gabapentin    Sedation at low doses   Hydrocodone Nausea And Vomiting   Morphine And Related Hives, Itching      Medication List    STOP taking these medications   cholestyramine 4 g packet Commonly known as:  QUESTRAN   cyclobenzaprine 10 MG tablet Commonly known as:  FLEXERIL   ibuprofen 200 MG tablet Commonly known as:  ADVIL,MOTRIN     TAKE these medications   aspirin EC 325 MG tablet Take 1 tablet (325 mg total) by mouth daily.   cetirizine 10 MG tablet Commonly known as:  ZYRTEC Take 10 mg by mouth daily as needed for allergies.   cyanocobalamin 2000 MCG tablet Take 2,000 mcg by mouth daily.   docusate sodium 100 MG capsule Commonly known as:  COLACE Take 1 capsule (100 mg total) by mouth daily as needed.   hydrocortisone 1 % ointment Apply 1 application topically 2 (two) times daily.   methocarbamol 500 MG tablet Commonly known as:  ROBAXIN Take 1-2 tablets (500-1,000 mg total) by mouth every 8 (eight) hours as needed for muscle spasms.   omeprazole 40 MG capsule Commonly known as:  PRILOSEC Take 40 mg by mouth daily.   oxyCODONE-acetaminophen 5-325 MG tablet Commonly known as:  PERCOCET/ROXICET Take 1-2 tablets by mouth every 4 (four) hours as needed for moderate pain or severe pain (For postoperative pain).   PROBIOTIC PO Take by mouth.   sertraline 100 MG tablet Commonly known as:  ZOLOFT Take 1 tablet (100 mg total) by mouth daily.   spironolactone 50 MG tablet Commonly known as:  ALDACTONE Take 50 mg by mouth 2 (two) times daily.   triamcinolone cream 0.1 % Commonly known as:  KENALOG Apply 1  application topically 2 (two) times daily.   triamcinolone 0.147 MG/GM topical spray Commonly known as:  KENALOG APPLY TO AFFECTED AREA(S) TWICE DAILY            Durable Medical Equipment  (From admission, onward)         Start     Ordered   07/31/18 1524  For home use only DME 3 n 1  Once     07/31/18 1523   07/31/18 1455  For home use only DME Tub bench  Once     07/31/18 1455   07/31/18 1455  For home use only DME Shower stool  Once     07/31/18 1455   07/31/18 1454  For home use only DME Walker rolling  Once    Question:  Patient needs a walker to treat with the following condition  Answer:  Tibial fracture   07/31/18 1455           Discharge Care Instructions  (From admission, onward)         Start     Ordered   08/01/18 0000  Non weight bearing    Question Answer Comment  Laterality left   Extremity Lower      08/01/18 0932          Discharge Assessment: Vitals:   07/31/18 1950 08/01/18 0500  BP: 117/84 121/84  Pulse: 82  84  Resp: 17 17  Temp: 97.6 F (36.4 C) 98.3 F (36.8 C)  SpO2: 100% 100%   Skin clean, dry and intact without evidence of skin break down, no evidence of skin tears noted. IV catheter discontinued intact. Site without signs and symptoms of complications - no redness or edema noted at insertion site, patient denies c/o pain - only slight tenderness at site.  Dressing with slight pressure applied.  D/c Instructions-Education: Discharge instructions given to patient/family with verbalized understanding. D/c education completed with patient/family including follow up instructions, medication list, d/c activities limitations if indicated, with other d/c instructions as indicated by MD - patient able to verbalize understanding, all questions fully answered. Patient instructed to return to ED, call 911, or call MD for any changes in condition.  Patient escorted via [place], and D/C home via private auto.  Eda Keys, RN 08/01/2018  12:51 PM

## 2018-08-01 NOTE — Progress Notes (Signed)
Occupational Therapy Treatment Patient Details Name: [name] MRN: [id number] DOB: 11-Feb-1981 Today's Date: 08/01/2018    History of present illness Pt is a 37 y/o female s/p Open reduction internal fixation of left tibial plateau fracture, and Repair of lateral meniscus.    OT comments  Pt progressing towards acute OT goals. Focus of session was LB ADLs and tub shower transfer. Spouse present and included in session. D/c plan remains appropriate.   Follow Up Recommendations  No OT follow up;Supervision/Assistance - 24 hour(inititally)    Equipment Recommendations  3 in 1 bedside commode;Tub/shower bench    Recommendations for Other Services      Precautions / Restrictions Precautions Precautions: Other (comment) Precaution Comments: Unrestricted ROM; hinge brace unlocked during day and locked in extension at night Required Braces or Orthoses: Other Brace/Splint Other Brace/Splint: hinged knee brace Restrictions Weight Bearing Restrictions: Yes LLE Weight Bearing: Non weight bearing       Mobility Bed Mobility               General bed mobility comments: up in chair  Transfers Overall transfer level: Needs assistance Equipment used: Rolling walker (2 wheeled) Transfers: Sit to/from Stand Sit to Stand: Min guard;Min assist         General transfer comment: Min to min guard for steadying assist to come to standing. Cues for safe hand placement.     Balance Overall balance assessment: Needs assistance Sitting-balance support: Feet supported;No upper extremity supported Sitting balance-Leahy Scale: Good     Standing balance support: Bilateral upper extremity supported Standing balance-Leahy Scale: Poor Standing balance comment: dependent on BUE support                           ADL either performed or assessed with clinical judgement   ADL Overall ADL's : Needs assistance/impaired                 Upper Body Dressing : Modified  independent;Sitting   Lower Body Dressing: Moderate assistance;Sit to/from stand                 General ADL Comments: Pt completed UB/LB dressing. Discussed tub bench transfer technique and provided handout.     Vision       Perception     Praxis      Cognition Arousal/Alertness: Awake/alert Behavior During Therapy: WFL for tasks assessed/performed Overall Cognitive Status: Within Functional Limits for tasks assessed                                          Exercises     Shoulder Instructions       General Comments husband present throughout    Pertinent Vitals/ Pain       Pain Assessment: 0-10 Pain Score: 6  Pain Location: LLE Pain Descriptors / Indicators: Throbbing;Grimacing Pain Intervention(s): Monitored during session;Limited activity within patient's tolerance;Repositioned;Ice applied  Home Living                                          Prior Functioning/Environment              Frequency  Min 2X/week        Progress Toward Goals  OT Goals(current goals can  now be found in the care plan section)  Progress towards OT goals: Progressing toward goals  Acute Rehab OT Goals Patient Stated Goal: to heal correctly and get full ROM in knee OT Goal Formulation: With patient Time For Goal Achievement: 08/14/18 Potential to Achieve Goals: Good ADL Goals Pt Will Perform Tub/Shower Transfer: Tub transfer;tub bench;rolling walker;with supervision;with caregiver independent in assisting Additional ADL Goal #1: Pt will demonstrate ability to use AE to access LB for ADL at independent level  Plan Discharge plan remains appropriate    Co-evaluation                 AM-PAC PT "6 Clicks" Daily Activity     Outcome Measure   Help from another person eating meals?: None Help from another person taking care of personal grooming?: None Help from another person toileting, which includes using toliet, bedpan,  or urinal?: A Little Help from another person bathing (including washing, rinsing, drying)?: A Little Help from another person to put on and taking off regular upper body clothing?: None Help from another person to put on and taking off regular lower body clothing?: A Lot 6 Click Score: 20    End of Session Equipment Utilized During Treatment: Gait belt;Rolling walker;Other (comment)(hinged knee brace)  OT Visit Diagnosis: Unsteadiness on feet (R26.81);Other abnormalities of gait and mobility (R26.89);Pain Pain - Right/Left: Left Pain - part of body: Leg   Activity Tolerance Patient tolerated treatment well   Patient Left in chair;with call bell/phone within reach;with family/visitor present   Nurse Communication          Time: 2947-6546 OT Time Calculation (min): 11 min  Charges: OT General Charges $OT Visit: 1 Visit OT Treatments $Self Care/Home Management : 8-22 mins  Tyrone Schimke, OT Acute Rehabilitation Services Pager: [phone] Office: [phone]    Hortencia Pilar 08/01/2018, 1:50 PM
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
## 2018-08-03 ENCOUNTER — Other Ambulatory Visit (HOSPITAL_COMMUNITY): Payer: Self-pay | Admitting: Cardiology

## 2018-08-03 MED ORDER — FLUCONAZOLE 150 MG PO TABS: 150.0000 mg | ORAL_TABLET | Freq: Every day | ORAL | 0 refills | Status: AC

## 2018-08-03 MED FILL — FLUCONAZOLE 150 MG TABS: 150 | 2 days supply | Qty: 2 | Fill #0

## 2018-08-04 MED FILL — SPIRONOLACTONE 50 MG TABLET: 50 | 30 days supply | Qty: 60 | Fill #1

## 2018-08-04 MED FILL — SERTRALINE HCL 100 MG TAB: 100 | 30 days supply | Qty: 30 | Fill #3

## 2018-08-18 MED FILL — OXYCODONE-ACETAMINOPHEN 5-3: 5-325 | 3 days supply | Qty: 10 | Fill #0

## 2018-09-03 ENCOUNTER — Other Ambulatory Visit: Payer: Self-pay | Admitting: Family Medicine

## 2018-09-03 MED FILL — SPIRONOLACTONE 50 MG TABS: 50 | 30 days supply | Qty: 60 | Fill #2

## 2018-09-03 MED FILL — SERTRALINE HCL 100 MG TAB: 100 | 30 days supply | Qty: 30 | Fill #0

## 2018-09-24 ENCOUNTER — Encounter: Payer: Self-pay | Admitting: Physical Therapy

## 2018-09-24 ENCOUNTER — Ambulatory Visit: Payer: PRIVATE HEALTH INSURANCE | Attending: Student | Admitting: Physical Therapy

## 2018-09-24 ENCOUNTER — Other Ambulatory Visit: Payer: Self-pay

## 2018-09-24 DIAGNOSIS — R2689 Other abnormalities of gait and mobility: Secondary | ICD-10-CM | POA: Insufficient documentation

## 2018-09-24 DIAGNOSIS — M25662 Stiffness of left knee, not elsewhere classified: Secondary | ICD-10-CM | POA: Diagnosis present

## 2018-09-24 DIAGNOSIS — M6281 Muscle weakness (generalized): Secondary | ICD-10-CM | POA: Diagnosis present

## 2018-09-24 DIAGNOSIS — M25562 Pain in left knee: Secondary | ICD-10-CM | POA: Diagnosis not present

## 2018-09-24 DIAGNOSIS — R600 Localized edema: Secondary | ICD-10-CM | POA: Insufficient documentation

## 2018-09-24 DIAGNOSIS — G8929 Other chronic pain: Secondary | ICD-10-CM | POA: Diagnosis present
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
## 2018-10-02 ENCOUNTER — Encounter

## 2018-10-06 ENCOUNTER — Encounter: Payer: Self-pay | Admitting: Physical Therapy

## 2018-10-06 ENCOUNTER — Ambulatory Visit: Payer: No Typology Code available for payment source | Attending: Family Medicine | Admitting: Physical Therapy

## 2018-10-06 DIAGNOSIS — M25562 Pain in left knee: Secondary | ICD-10-CM | POA: Diagnosis present

## 2018-10-06 DIAGNOSIS — G8929 Other chronic pain: Secondary | ICD-10-CM | POA: Diagnosis present

## 2018-10-06 DIAGNOSIS — M25662 Stiffness of left knee, not elsewhere classified: Secondary | ICD-10-CM | POA: Insufficient documentation

## 2018-10-06 DIAGNOSIS — R2689 Other abnormalities of gait and mobility: Secondary | ICD-10-CM | POA: Insufficient documentation

## 2018-10-06 DIAGNOSIS — M6281 Muscle weakness (generalized): Secondary | ICD-10-CM | POA: Insufficient documentation

## 2018-10-06 DIAGNOSIS — R600 Localized edema: Secondary | ICD-10-CM | POA: Diagnosis present
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
## 2018-10-08 ENCOUNTER — Ambulatory Visit: Payer: No Typology Code available for payment source | Admitting: Physical Therapy

## 2018-10-08 DIAGNOSIS — M25662 Stiffness of left knee, not elsewhere classified: Secondary | ICD-10-CM

## 2018-10-08 DIAGNOSIS — M25562 Pain in left knee: Principal | ICD-10-CM

## 2018-10-08 DIAGNOSIS — R2689 Other abnormalities of gait and mobility: Secondary | ICD-10-CM

## 2018-10-08 DIAGNOSIS — G8929 Other chronic pain: Secondary | ICD-10-CM

## 2018-10-08 DIAGNOSIS — R600 Localized edema: Secondary | ICD-10-CM

## 2018-10-08 DIAGNOSIS — M6281 Muscle weakness (generalized): Secondary | ICD-10-CM
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
## 2018-10-13 ENCOUNTER — Encounter: Payer: Self-pay | Admitting: Physical Therapy

## 2018-10-13 ENCOUNTER — Ambulatory Visit: Payer: No Typology Code available for payment source | Admitting: Physical Therapy

## 2018-10-13 DIAGNOSIS — M25662 Stiffness of left knee, not elsewhere classified: Secondary | ICD-10-CM

## 2018-10-13 DIAGNOSIS — G8929 Other chronic pain: Secondary | ICD-10-CM

## 2018-10-13 DIAGNOSIS — R2689 Other abnormalities of gait and mobility: Secondary | ICD-10-CM

## 2018-10-13 DIAGNOSIS — M25562 Pain in left knee: Secondary | ICD-10-CM | POA: Diagnosis not present

## 2018-10-13 DIAGNOSIS — M6281 Muscle weakness (generalized): Secondary | ICD-10-CM

## 2018-10-13 DIAGNOSIS — R600 Localized edema: Secondary | ICD-10-CM
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
## 2018-10-15 ENCOUNTER — Ambulatory Visit: Payer: No Typology Code available for payment source | Admitting: Physical Therapy

## 2018-10-15 MED FILL — SPIRONOLACTONE 50 MG TABS: 50 | 30 days supply | Qty: 60 | Fill #3

## 2018-10-15 MED FILL — SERTRALINE HCL 100 MG TAB: 100 | 30 days supply | Qty: 30 | Fill #1

## 2018-10-20 ENCOUNTER — Encounter: Payer: Self-pay | Admitting: Physical Therapy

## 2018-10-20 ENCOUNTER — Ambulatory Visit: Payer: PRIVATE HEALTH INSURANCE | Attending: Family Medicine | Admitting: Physical Therapy

## 2018-10-20 DIAGNOSIS — R2689 Other abnormalities of gait and mobility: Secondary | ICD-10-CM

## 2018-10-20 DIAGNOSIS — R600 Localized edema: Secondary | ICD-10-CM

## 2018-10-20 DIAGNOSIS — M25662 Stiffness of left knee, not elsewhere classified: Secondary | ICD-10-CM | POA: Diagnosis present

## 2018-10-20 DIAGNOSIS — M6281 Muscle weakness (generalized): Secondary | ICD-10-CM | POA: Insufficient documentation

## 2018-10-20 DIAGNOSIS — G8929 Other chronic pain: Secondary | ICD-10-CM | POA: Insufficient documentation

## 2018-10-20 DIAGNOSIS — M25562 Pain in left knee: Secondary | ICD-10-CM | POA: Insufficient documentation
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
## 2018-10-22 ENCOUNTER — Encounter: Payer: Self-pay | Admitting: Physical Therapy

## 2018-10-22 ENCOUNTER — Ambulatory Visit: Payer: PRIVATE HEALTH INSURANCE | Admitting: Physical Therapy

## 2018-10-22 DIAGNOSIS — M25562 Pain in left knee: Principal | ICD-10-CM

## 2018-10-22 DIAGNOSIS — G8929 Other chronic pain: Secondary | ICD-10-CM

## 2018-10-22 DIAGNOSIS — M25662 Stiffness of left knee, not elsewhere classified: Secondary | ICD-10-CM

## 2018-10-22 DIAGNOSIS — R600 Localized edema: Secondary | ICD-10-CM

## 2018-10-22 DIAGNOSIS — M6281 Muscle weakness (generalized): Secondary | ICD-10-CM

## 2018-10-22 DIAGNOSIS — R2689 Other abnormalities of gait and mobility: Secondary | ICD-10-CM
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
## 2018-10-27 ENCOUNTER — Ambulatory Visit: Payer: PRIVATE HEALTH INSURANCE | Admitting: Physical Therapy

## 2018-10-27 ENCOUNTER — Encounter: Payer: Self-pay | Admitting: Physical Therapy

## 2018-10-27 DIAGNOSIS — M25562 Pain in left knee: Principal | ICD-10-CM

## 2018-10-27 DIAGNOSIS — G8929 Other chronic pain: Secondary | ICD-10-CM

## 2018-10-27 DIAGNOSIS — M25662 Stiffness of left knee, not elsewhere classified: Secondary | ICD-10-CM

## 2018-10-27 DIAGNOSIS — M6281 Muscle weakness (generalized): Secondary | ICD-10-CM

## 2018-10-27 DIAGNOSIS — R600 Localized edema: Secondary | ICD-10-CM

## 2018-10-27 DIAGNOSIS — R2689 Other abnormalities of gait and mobility: Secondary | ICD-10-CM
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
## 2018-10-29 ENCOUNTER — Encounter: Payer: Self-pay | Admitting: Physical Therapy

## 2018-10-29 ENCOUNTER — Ambulatory Visit: Payer: PRIVATE HEALTH INSURANCE | Admitting: Physical Therapy

## 2018-10-29 DIAGNOSIS — R2689 Other abnormalities of gait and mobility: Secondary | ICD-10-CM

## 2018-10-29 DIAGNOSIS — G8929 Other chronic pain: Secondary | ICD-10-CM

## 2018-10-29 DIAGNOSIS — M6281 Muscle weakness (generalized): Secondary | ICD-10-CM

## 2018-10-29 DIAGNOSIS — M25562 Pain in left knee: Secondary | ICD-10-CM | POA: Diagnosis not present

## 2018-10-29 DIAGNOSIS — R600 Localized edema: Secondary | ICD-10-CM

## 2018-10-29 DIAGNOSIS — M25662 Stiffness of left knee, not elsewhere classified: Secondary | ICD-10-CM
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
## 2018-11-03 ENCOUNTER — Ambulatory Visit: Payer: PRIVATE HEALTH INSURANCE | Admitting: Physical Therapy

## 2018-11-03 ENCOUNTER — Encounter: Payer: Self-pay | Admitting: Physical Therapy

## 2018-11-03 DIAGNOSIS — R600 Localized edema: Secondary | ICD-10-CM

## 2018-11-03 DIAGNOSIS — G8929 Other chronic pain: Secondary | ICD-10-CM

## 2018-11-03 DIAGNOSIS — M25562 Pain in left knee: Secondary | ICD-10-CM | POA: Diagnosis not present

## 2018-11-03 DIAGNOSIS — R2689 Other abnormalities of gait and mobility: Secondary | ICD-10-CM

## 2018-11-03 DIAGNOSIS — M25662 Stiffness of left knee, not elsewhere classified: Secondary | ICD-10-CM

## 2018-11-03 DIAGNOSIS — M6281 Muscle weakness (generalized): Secondary | ICD-10-CM
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
## 2018-11-05 ENCOUNTER — Ambulatory Visit: Payer: PRIVATE HEALTH INSURANCE | Admitting: Physical Therapy

## 2018-11-05 ENCOUNTER — Encounter: Payer: Self-pay | Admitting: Physical Therapy

## 2018-11-05 DIAGNOSIS — M25562 Pain in left knee: Secondary | ICD-10-CM | POA: Diagnosis not present

## 2018-11-05 DIAGNOSIS — M6281 Muscle weakness (generalized): Secondary | ICD-10-CM

## 2018-11-05 DIAGNOSIS — R2689 Other abnormalities of gait and mobility: Secondary | ICD-10-CM

## 2018-11-05 DIAGNOSIS — G8929 Other chronic pain: Secondary | ICD-10-CM

## 2018-11-05 DIAGNOSIS — R600 Localized edema: Secondary | ICD-10-CM

## 2018-11-05 DIAGNOSIS — M25662 Stiffness of left knee, not elsewhere classified: Secondary | ICD-10-CM
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
## 2018-11-10 ENCOUNTER — Ambulatory Visit: Payer: PRIVATE HEALTH INSURANCE | Admitting: Physical Therapy

## 2018-11-10 ENCOUNTER — Encounter: Payer: Self-pay | Admitting: Physical Therapy

## 2018-11-10 DIAGNOSIS — G8929 Other chronic pain: Secondary | ICD-10-CM

## 2018-11-10 DIAGNOSIS — M25562 Pain in left knee: Secondary | ICD-10-CM | POA: Diagnosis not present

## 2018-11-10 DIAGNOSIS — M6281 Muscle weakness (generalized): Secondary | ICD-10-CM

## 2018-11-10 DIAGNOSIS — M25662 Stiffness of left knee, not elsewhere classified: Secondary | ICD-10-CM

## 2018-11-10 DIAGNOSIS — R2689 Other abnormalities of gait and mobility: Secondary | ICD-10-CM

## 2018-11-10 DIAGNOSIS — R600 Localized edema: Secondary | ICD-10-CM

## 2018-11-10 NOTE — Addendum Note (Signed)
Addended by: Larey Days on: 11/10/2018 01:29 PM   Modules accepted: Orders
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
## 2018-11-11 ENCOUNTER — Ambulatory Visit (INDEPENDENT_AMBULATORY_CARE_PROVIDER_SITE_OTHER): Payer: No Typology Code available for payment source | Admitting: Family Medicine

## 2018-11-11 ENCOUNTER — Encounter: Payer: Self-pay | Admitting: Family Medicine

## 2018-11-11 VITALS — BP 96/62 | HR 96 | Temp 98.2°F | Ht 63.0 in | Wt 145.4 lb

## 2018-11-11 DIAGNOSIS — M7711 Lateral epicondylitis, right elbow: Secondary | ICD-10-CM

## 2018-11-11 MED FILL — SERTRALINE HCL 100 MG TAB: 100 | 30 days supply | Qty: 30 | Fill #2 | Status: TO

## 2018-11-11 MED FILL — SPIRONOLACTONE 50 MG TABLET: 50 | 30 days supply | Qty: 60 | Fill #4 | Status: TO
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
## 2018-11-11 NOTE — Patient Instructions (Signed)
Great to see you.  Do the tennis elbow exercises that we discussed and keep me updated.

## 2018-11-12 ENCOUNTER — Ambulatory Visit: Payer: No Typology Code available for payment source | Attending: Student | Admitting: Physical Therapy

## 2018-11-12 ENCOUNTER — Encounter: Payer: Self-pay | Admitting: Physical Therapy

## 2018-11-12 DIAGNOSIS — M25662 Stiffness of left knee, not elsewhere classified: Secondary | ICD-10-CM | POA: Diagnosis present

## 2018-11-12 DIAGNOSIS — G8929 Other chronic pain: Secondary | ICD-10-CM | POA: Diagnosis present

## 2018-11-12 DIAGNOSIS — M6281 Muscle weakness (generalized): Secondary | ICD-10-CM | POA: Insufficient documentation

## 2018-11-12 DIAGNOSIS — R600 Localized edema: Secondary | ICD-10-CM | POA: Diagnosis present

## 2018-11-12 DIAGNOSIS — R2689 Other abnormalities of gait and mobility: Secondary | ICD-10-CM | POA: Insufficient documentation

## 2018-11-12 DIAGNOSIS — M25562 Pain in left knee: Secondary | ICD-10-CM | POA: Diagnosis not present
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
## 2018-11-17 ENCOUNTER — Encounter: Payer: Self-pay | Admitting: Physical Therapy

## 2018-11-17 ENCOUNTER — Ambulatory Visit: Payer: PRIVATE HEALTH INSURANCE | Attending: Student | Admitting: Physical Therapy

## 2018-11-17 DIAGNOSIS — M6281 Muscle weakness (generalized): Secondary | ICD-10-CM | POA: Diagnosis present

## 2018-11-17 DIAGNOSIS — M25662 Stiffness of left knee, not elsewhere classified: Secondary | ICD-10-CM | POA: Diagnosis present

## 2018-11-17 DIAGNOSIS — R600 Localized edema: Secondary | ICD-10-CM

## 2018-11-17 DIAGNOSIS — R2689 Other abnormalities of gait and mobility: Secondary | ICD-10-CM

## 2018-11-17 DIAGNOSIS — G8929 Other chronic pain: Secondary | ICD-10-CM | POA: Diagnosis present

## 2018-11-17 DIAGNOSIS — M25562 Pain in left knee: Secondary | ICD-10-CM | POA: Diagnosis not present
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
## 2018-11-25 ENCOUNTER — Other Ambulatory Visit (HOSPITAL_COMMUNITY): Payer: Self-pay | Admitting: Cardiology

## 2018-11-25 MED ORDER — FLUCONAZOLE 150 MG PO TABS: 150.0000 mg | ORAL_TABLET | Freq: Every day | ORAL | 0 refills | Status: DC

## 2018-11-25 MED FILL — FLUCONAZOLE 150 MG TABS: 150 | 4 days supply | Qty: 2 | Fill #0

## 2018-12-11 MED FILL — SERTRALINE HCL 100 MG TAB: 100 | 30 days supply | Qty: 30 | Fill #0

## 2018-12-11 MED FILL — SPIRONOLACTONE 50 MG TABS: 50 | 30 days supply | Qty: 60 | Fill #0

## 2019-01-15 ENCOUNTER — Other Ambulatory Visit: Payer: Self-pay | Admitting: Family Medicine

## 2019-01-15 MED FILL — SERTRALINE HCL 100 MG TAB: 100 | 30 days supply | Qty: 30 | Fill #0 | Status: TO

## 2019-01-15 MED FILL — SPIRONOLACTONE 50 MG TABS: 50 | 30 days supply | Qty: 60 | Fill #1 | Status: TO

## 2019-02-09 ENCOUNTER — Other Ambulatory Visit: Payer: Self-pay | Admitting: Student

## 2019-02-09 DIAGNOSIS — S83282A Other tear of lateral meniscus, current injury, left knee, initial encounter: Secondary | ICD-10-CM

## 2019-02-11 ENCOUNTER — Encounter: Payer: Self-pay | Admitting: Family Medicine

## 2019-02-12 DIAGNOSIS — F329 Major depressive disorder, single episode, unspecified: Secondary | ICD-10-CM | POA: Insufficient documentation

## 2019-02-12 DIAGNOSIS — F32A Depression, unspecified: Secondary | ICD-10-CM | POA: Insufficient documentation
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
## 2019-02-15 ENCOUNTER — Telehealth (INDEPENDENT_AMBULATORY_CARE_PROVIDER_SITE_OTHER): Payer: No Typology Code available for payment source | Admitting: Family Medicine

## 2019-02-15 ENCOUNTER — Encounter: Payer: Self-pay | Admitting: Family Medicine

## 2019-02-15 VITALS — Temp 97.5°F | Wt 150.0 lb

## 2019-02-15 DIAGNOSIS — G4700 Insomnia, unspecified: Secondary | ICD-10-CM | POA: Diagnosis not present

## 2019-02-15 DIAGNOSIS — F32A Depression, unspecified: Secondary | ICD-10-CM

## 2019-02-15 DIAGNOSIS — F329 Major depressive disorder, single episode, unspecified: Secondary | ICD-10-CM | POA: Diagnosis not present

## 2019-02-15 DIAGNOSIS — F419 Anxiety disorder, unspecified: Secondary | ICD-10-CM

## 2019-02-15 DIAGNOSIS — F418 Other specified anxiety disorders: Secondary | ICD-10-CM

## 2019-02-15 MED ORDER — TRAZODONE HCL 50 MG PO TABS: 25.0000 mg | ORAL_TABLET | Freq: Every evening | ORAL | 3 refills | Status: DC | PRN

## 2019-02-15 MED FILL — traZODone HCL 50 MG TABS: 50 | 30 days supply | Qty: 30 | Fill #0

## 2019-02-15 NOTE — Assessment & Plan Note (Signed)
  The problem of recurrent insomnia is discussed. Avoidance of caffeine sources is strongly encouraged. Sleep hygiene issues are reviewed.eRx sent for trazodone 25-50 mg nightly for insomnia.  She will update me in 1-2 weeks. The patient indicates understanding of these issues and agrees with the plan.

## 2019-02-15 NOTE — Assessment & Plan Note (Signed)
>  25 minutes spent in face to face time with patient, >50% spent in counselling or coordination of care discussing anxiety, depression and insomnia.  We agreed to a trial of trazodone at bedtime prior to increasing the dose of her zoloft as she does feel this is situational (zoloft was working well) and that if she got some sleep, she would feel better.  See below.

## 2019-02-17 ENCOUNTER — Other Ambulatory Visit: Payer: Self-pay

## 2019-02-17 ENCOUNTER — Ambulatory Visit (HOSPITAL_COMMUNITY)
Admission: RE | Admit: 2019-02-17 | Discharge: 2019-02-17 | Disposition: A | Discharge status: Home / Self Care | Payer: PRIVATE HEALTH INSURANCE | Source: Ambulatory Visit | Attending: Student | Admitting: Student

## 2019-02-17 DIAGNOSIS — S83282A Other tear of lateral meniscus, current injury, left knee, initial encounter: Secondary | ICD-10-CM | POA: Insufficient documentation

## 2019-02-17 MED FILL — SERTRALINE HCL 100 MG TAB: 100 | 30 days supply | Qty: 30 | Fill #0

## 2019-02-17 MED FILL — SPIRONOLACTONE 50 MG TABLET: 50 | 30 days supply | Qty: 60 | Fill #0

## 2019-03-09 MED FILL — DICLOFENAC SODIUM 1 % GEL: 1 | 25 days supply | Qty: 100 | Fill #0

## 2019-03-23 ENCOUNTER — Encounter: Payer: Self-pay | Admitting: Family Medicine

## 2019-03-23 ENCOUNTER — Other Ambulatory Visit: Payer: Self-pay | Admitting: Family Medicine

## 2019-03-23 MED FILL — SERTRALINE HCL 100 MG TAB: 100 | 30 days supply | Qty: 30 | Fill #1

## 2019-03-23 MED FILL — traZODone HCL 50 MG TABS: 50 | 30 days supply | Qty: 30 | Fill #1

## 2019-03-24 MED ORDER — TRIAMCINOLONE ACETONIDE 0.1 % EX CREA: 1.0000 "application " | TOPICAL_CREAM | Freq: Two times a day (BID) | CUTANEOUS | 0 refills | Status: DC

## 2019-03-24 MED ORDER — TRIAMCINOLONE ACETONIDE 0.147 MG/GM EX AERS: INHALATION_SPRAY | CUTANEOUS | 0 refills | Status: DC

## 2019-03-24 MED FILL — TRIAMCINOLONE 0.147 MG/G SP: 0.147 | 14 days supply | Qty: 63 | Fill #0

## 2019-03-24 MED FILL — TRIAMCINOLONE 0.1% CREAM: 0.1 | 7 days supply | Qty: 30 | Fill #0

## 2019-03-26 MED FILL — SPIRONOLACTONE 50 MG TABLET: 50 | 30 days supply | Qty: 60 | Fill #1

## 2019-04-05 ENCOUNTER — Encounter: Payer: Self-pay | Admitting: Family Medicine

## 2019-04-06 ENCOUNTER — Ambulatory Visit (INDEPENDENT_AMBULATORY_CARE_PROVIDER_SITE_OTHER)
Admission: RE | Admit: 2019-04-06 | Discharge: 2019-04-06 | Disposition: A | Discharge status: Home / Self Care | Payer: No Typology Code available for payment source | Source: Ambulatory Visit

## 2019-04-06 DIAGNOSIS — M79674 Pain in right toe(s): Secondary | ICD-10-CM

## 2019-04-06 DIAGNOSIS — S3992XA Unspecified injury of lower back, initial encounter: Secondary | ICD-10-CM | POA: Diagnosis not present

## 2019-04-06 DIAGNOSIS — M533 Sacrococcygeal disorders, not elsewhere classified: Secondary | ICD-10-CM

## 2019-04-06 DIAGNOSIS — S99921A Unspecified injury of right foot, initial encounter: Secondary | ICD-10-CM

## 2019-04-06 DIAGNOSIS — W109XXA Fall (on) (from) unspecified stairs and steps, initial encounter: Secondary | ICD-10-CM

## 2019-04-06 DIAGNOSIS — W19XXXA Unspecified fall, initial encounter: Secondary | ICD-10-CM

## 2019-04-06 NOTE — Discharge Instructions (Signed)
Most likely a really bad bruise to the area.  Rest, Ice, Elevate as you have been doing.  Buddy tape the toes.  Follow up as needed for continued or worsening symptoms
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
## 2019-04-22 ENCOUNTER — Other Ambulatory Visit: Payer: Self-pay | Admitting: Family Medicine

## 2019-04-22 MED FILL — SERTRALINE HCL 100 MG TAB: 100 | 90 days supply | Qty: 90 | Fill #0

## 2019-04-22 MED FILL — traZODone HCL 50 MG TABS: 50 | 30 days supply | Qty: 30 | Fill #2

## 2019-05-31 MED FILL — SPIRONOLACTONE 50 MG TABLET: 50 | 30 days supply | Qty: 60 | Fill #2

## 2019-05-31 MED FILL — traZODone HCL 50 MG TABS: 50 | 30 days supply | Qty: 30 | Fill #3

## 2019-06-24 MED FILL — SPIRONOLACTONE 50 MG TABLET: 50 | 30 days supply | Qty: 60 | Fill #0

## 2019-06-29 ENCOUNTER — Other Ambulatory Visit: Payer: Self-pay | Admitting: Family Medicine

## 2019-06-29 MED FILL — traZODone HCL 50 MG TABS: 50 | 30 days supply | Qty: 30 | Fill #0

## 2019-07-22 MED FILL — FLUCONAZOLE 150 MG TABLET: 150 | 2 days supply | Qty: 2 | Fill #0

## 2019-07-31 DIAGNOSIS — F410 Panic disorder [episodic paroxysmal anxiety] without agoraphobia: Secondary | ICD-10-CM | POA: Insufficient documentation
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
## 2019-08-02 ENCOUNTER — Telehealth: Payer: Self-pay

## 2019-08-02 MED FILL — SPIRONOLACTONE 50 MG TABLET: 50 | 30 days supply | Qty: 60 | Fill #0

## 2019-08-02 MED FILL — traZODone HCL 50 MG TABS: 50 | 30 days supply | Qty: 30 | Fill #1

## 2019-08-02 NOTE — Telephone Encounter (Signed)
Travel or Contacts:   Any travel in the past 2 weeks?   Have you came in contact with anyone who has Covid?NO  Have you had a positive Covid test? If so, when?NO  Fever >100.42F []   Yes [x]   No []   Unknown  Chills []   Yes [x]   No []   Unknown  Muscle aches (myalgia) []   Yes [x]   No []   Unknown  Runny nose (rhinorrhea) []   Yes [x]   No []   Unknown  Sore throat []   Yes [x]   No []   Unknown Cough (new onset or worsening of chronic cough) []   Yes [x]   No []   Unknown  Shortness of breath (dyspnea) []   Yes [x]   No []   Unknown Nausea or vomiting []   Yes [x]   No []   Unknown  Headache []   Yes [x]   No []   Unknown  Abdominal pain  []   Yes [x]   No []   Unknown  Diarrhea (?3 loose/looser than normal stools/24hr period) []   Yes [x]   No []   Unknown Other, s:pecify

## 2019-08-03 ENCOUNTER — Encounter: Payer: Self-pay | Admitting: Family Medicine

## 2019-08-03 ENCOUNTER — Other Ambulatory Visit: Payer: Self-pay

## 2019-08-03 ENCOUNTER — Ambulatory Visit (INDEPENDENT_AMBULATORY_CARE_PROVIDER_SITE_OTHER): Payer: No Typology Code available for payment source | Admitting: Family Medicine

## 2019-08-03 VITALS — BP 100/60 | HR 104 | Temp 98.2°F | Ht 63.0 in | Wt 153.6 lb

## 2019-08-03 DIAGNOSIS — G4700 Insomnia, unspecified: Secondary | ICD-10-CM

## 2019-08-03 DIAGNOSIS — F410 Panic disorder [episodic paroxysmal anxiety] without agoraphobia: Secondary | ICD-10-CM | POA: Diagnosis not present

## 2019-08-03 DIAGNOSIS — F418 Other specified anxiety disorders: Secondary | ICD-10-CM | POA: Diagnosis not present

## 2019-08-03 DIAGNOSIS — Z23 Encounter for immunization: Secondary | ICD-10-CM

## 2019-08-03 MED ORDER — SERTRALINE HCL 100 MG PO TABS: 150.0000 mg | ORAL_TABLET | Freq: Every day | ORAL | 3 refills | Status: DC

## 2019-08-03 MED ORDER — ALPRAZOLAM 0.25 MG PO TABS: 0.2500 mg | ORAL_TABLET | Freq: Three times a day (TID) | ORAL | 0 refills | Status: DC | PRN

## 2019-08-03 MED FILL — ALPRAZolam 0.25 MG TABS: 0.25 | 10 days supply | Qty: 30 | Fill #0

## 2019-08-03 MED FILL — SERTRALINE HCL 100 MG TAB: 100 | 60 days supply | Qty: 90 | Fill #0
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
## 2019-08-03 NOTE — Patient Instructions (Signed)
Great to see you.  We are increasing your Zoloft to 150 mg nightly.  Okay to continue trazodone.  Xanax as needed for panic attacks, try on weekends first.   Please call [place] at [phone]- please ask to see Dr. Zella Ball (tell her I sent you).   [phone] is my cell.  Keep me updated.  And don't forget, you are so far from a failure!!!

## 2019-09-06 MED FILL — SPIRONOLACTONE 50 MG TABLET: 50 | 30 days supply | Qty: 60 | Fill #1

## 2019-09-06 MED FILL — traZODone HCL 50 MG TABS: 50 | 30 days supply | Qty: 30 | Fill #2

## 2019-10-07 MED FILL — SPIRONOLACTONE 50 MG TABLET: 50 | 30 days supply | Qty: 60 | Fill #2

## 2019-10-07 MED FILL — traZODone HCL 50 MG TABS: 50 | 30 days supply | Qty: 30 | Fill #3

## 2019-10-07 MED FILL — SERTRALINE HCL 100 MG TAB: 100 | 60 days supply | Qty: 90 | Fill #1

## 2019-11-09 MED FILL — SPIRONOLACTONE 50 MG TABLET: 50 | 30 days supply | Qty: 60 | Fill #3
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
## 2019-11-23 NOTE — Patient Instructions (Addendum)
Blood work was ordered.    All other Health Maintenance issues reviewed.   All recommended immunizations and age-appropriate screenings are up-to-date or discussed.  No immunization administered today.   Medications reviewed and updated.  Changes include :   none  Your prescription(s) have been submitted to your pharmacy. Please take as directed and contact our office if you believe you are having problem(s) with the medication(s).    Please followup in 6 months    Health Maintenance, Female Adopting a healthy lifestyle and getting preventive care are important in promoting health and wellness. Ask your health care provider about:  The right schedule for you to have regular tests and exams.  Things you can do on your own to prevent diseases and keep yourself healthy. What should I know about diet, weight, and exercise? Eat a healthy diet   Eat a diet that includes plenty of vegetables, fruits, low-fat dairy products, and lean protein.  Do not eat a lot of foods that are high in solid fats, added sugars, or sodium. Maintain a healthy weight Body mass index (BMI) is used to identify weight problems. It estimates body fat based on height and weight. Your health care provider can help determine your BMI and help you achieve or maintain a healthy weight. Get regular exercise Get regular exercise. This is one of the most important things you can do for your health. Most adults should:  Exercise for at least 150 minutes each week. The exercise should increase your heart rate and make you sweat (moderate-intensity exercise).  Do strengthening exercises at least twice a week. This is in addition to the moderate-intensity exercise.  Spend less time sitting. Even light physical activity can be beneficial. Watch cholesterol and blood lipids Have your blood tested for lipids and cholesterol at 39 years of age, then have this test every 5 years. Have your cholesterol levels checked more  often if:  Your lipid or cholesterol levels are high.  You are older than 40 years of age.  You are at high risk for heart disease. What should I know about cancer screening? Depending on your health history and family history, you may need to have cancer screening at various ages. This may include screening for:  Breast cancer.  Cervical cancer.  Colorectal cancer.  Skin cancer.  Lung cancer. What should I know about heart disease, diabetes, and high blood pressure? Blood pressure and heart disease  High blood pressure causes heart disease and increases the risk of stroke. This is more likely to develop in people who have high blood pressure readings, are of African descent, or are overweight.  Have your blood pressure checked: ? Every 3-5 years if you are 18-39 years of age. ? Every year if you are 40 years old or older. Diabetes Have regular diabetes screenings. This checks your fasting blood sugar level. Have the screening done:  Once every three years after age 40 if you are at a normal weight and have a low risk for diabetes.  More often and at a younger age if you are overweight or have a high risk for diabetes. What should I know about preventing infection? Hepatitis B If you have a higher risk for hepatitis B, you should be screened for this virus. Talk with your health care provider to find out if you are at risk for hepatitis B infection. Hepatitis C Testing is recommended for:  Everyone born from 1945 through 1965.  Anyone with known risk factors for hepatitis   C. Sexually transmitted infections (STIs)  Get screened for STIs, including gonorrhea and chlamydia, if: ? You are sexually active and are younger than 39 years of age. ? You are older than 39 years of age and your health care provider tells you that you are at risk for this type of infection. ? Your sexual activity has changed since you were last screened, and you are at increased risk for chlamydia  or gonorrhea. Ask your health care provider if you are at risk.  Ask your health care provider about whether you are at high risk for HIV. Your health care provider may recommend a prescription medicine to help prevent HIV infection. If you choose to take medicine to prevent HIV, you should first get tested for HIV. You should then be tested every 3 months for as long as you are taking the medicine. Pregnancy  If you are about to stop having your period (premenopausal) and you may become pregnant, seek counseling before you get pregnant.  Take 400 to 800 micrograms (mcg) of folic acid every day if you become pregnant.  Ask for birth control (contraception) if you want to prevent pregnancy. Osteoporosis and menopause Osteoporosis is a disease in which the bones lose minerals and strength with aging. This can result in bone fractures. If you are 65 years old or older, or if you are at risk for osteoporosis and fractures, ask your health care provider if you should:  Be screened for bone loss.  Take a calcium or vitamin D supplement to lower your risk of fractures.  Be given hormone replacement therapy (HRT) to treat symptoms of menopause. Follow these instructions at home: Lifestyle  Do not use any products that contain nicotine or tobacco, such as cigarettes, e-cigarettes, and chewing tobacco. If you need help quitting, ask your health care provider.  Do not use street drugs.  Do not share needles.  Ask your health care provider for help if you need support or information about quitting drugs. Alcohol use  Do not drink alcohol if: ? Your health care provider tells you not to drink. ? You are pregnant, may be pregnant, or are planning to become pregnant.  If you drink alcohol: ? Limit how much you use to 0-1 drink a day. ? Limit intake if you are breastfeeding.  Be aware of how much alcohol is in your drink. In the [place] one drink equals one 12 oz bottle of beer (355 mL), one 5 oz  glass of wine (148 mL), or one 1 oz glass of hard liquor (44 mL). General instructions  Schedule regular health, dental, and eye exams.  Stay current with your vaccines.  Tell your health care provider if: ? You often feel depressed. ? You have ever been abused or do not feel safe at home. Summary  Adopting a healthy lifestyle and getting preventive care are important in promoting health and wellness.  Follow your health care provider's instructions about healthy diet, exercising, and getting tested or screened for diseases.  Follow your health care provider's instructions on monitoring your cholesterol and blood pressure. This information is not intended to replace advice given to you by your health care provider. Make sure you discuss any questions you have with your health care provider. Document Revised: 08/26/2018 Document Reviewed: 08/26/2018 Elsevier Patient Education  2020 Elsevier Inc.  

## 2019-11-24 ENCOUNTER — Ambulatory Visit (INDEPENDENT_AMBULATORY_CARE_PROVIDER_SITE_OTHER): Payer: No Typology Code available for payment source | Admitting: Internal Medicine

## 2019-11-24 ENCOUNTER — Encounter: Payer: Self-pay | Admitting: Internal Medicine

## 2019-11-24 ENCOUNTER — Other Ambulatory Visit: Payer: Self-pay

## 2019-11-24 VITALS — BP 108/72 | HR 75 | Temp 98.2°F | Ht 63.0 in | Wt 158.1 lb

## 2019-11-24 DIAGNOSIS — F419 Anxiety disorder, unspecified: Secondary | ICD-10-CM

## 2019-11-24 DIAGNOSIS — F329 Major depressive disorder, single episode, unspecified: Secondary | ICD-10-CM

## 2019-11-24 DIAGNOSIS — O24419 Gestational diabetes mellitus in pregnancy, unspecified control: Secondary | ICD-10-CM

## 2019-11-24 DIAGNOSIS — F32A Depression, unspecified: Secondary | ICD-10-CM

## 2019-11-24 DIAGNOSIS — Z0000 Encounter for general adult medical examination without abnormal findings: Secondary | ICD-10-CM

## 2019-11-24 DIAGNOSIS — F410 Panic disorder [episodic paroxysmal anxiety] without agoraphobia: Secondary | ICD-10-CM

## 2019-11-24 DIAGNOSIS — K219 Gastro-esophageal reflux disease without esophagitis: Secondary | ICD-10-CM

## 2019-11-24 DIAGNOSIS — G4709 Other insomnia: Secondary | ICD-10-CM | POA: Diagnosis not present

## 2019-11-24 LAB — COMPREHENSIVE METABOLIC PANEL
ALT: 13 U/L (ref 0–35)
AST: 12 U/L (ref 0–37)
Albumin: 4.1 g/dL (ref 3.5–5.2)
Alkaline Phosphatase: 60 U/L (ref 39–117)
BUN: 10 mg/dL (ref 6–23)
CO2: 27 mEq/L (ref 19–32)
Calcium: 8.9 mg/dL (ref 8.4–10.5)
Chloride: 105 mEq/L (ref 96–112)
Creatinine, Ser: 0.72 mg/dL (ref 0.40–1.20)
GFR: 90.18 mL/min (ref >60.00)
Glucose, Bld: 92 mg/dL (ref 70–99)
Potassium: 3.8 mEq/L (ref 3.5–5.1)
Sodium: 137 mEq/L (ref 135–145)
Total Bilirubin: 0.4 mg/dL (ref 0.2–1.2)
Total Protein: 7.1 g/dL (ref 6.0–8.3)

## 2019-11-24 LAB — TSH: TSH: 1.42 u[IU]/mL (ref 0.35–4.50)

## 2019-11-24 LAB — CBC WITH DIFFERENTIAL/PLATELET
Basophils Absolute: 0 10*3/uL (ref 0.0–0.1)
Basophils Relative: 0.4 % (ref 0.0–3.0)
Eosinophils Absolute: 0.1 10*3/uL (ref 0.0–0.7)
Eosinophils Relative: 0.8 % (ref 0.0–5.0)
HCT: 37.5 % (ref 36.0–46.0)
Hemoglobin: 12.7 g/dL (ref 12.0–15.0)
Lymphocytes Relative: 27.3 % (ref 12.0–46.0)
Lymphs Abs: 2.2 10*3/uL (ref 0.7–4.0)
MCHC: 33.7 g/dL (ref 30.0–36.0)
MCV: 81.7 fl (ref 78.0–100.0)
Monocytes Absolute: 0.3 10*3/uL (ref 0.1–1.0)
Monocytes Relative: 4.1 % (ref 3.0–12.0)
Neutro Abs: 5.5 10*3/uL (ref 1.4–7.7)
Neutrophils Relative %: 67.4 % (ref 43.0–77.0)
Platelets: 278 10*3/uL (ref 150.0–400.0)
RBC: 4.59 Mil/uL (ref 3.87–5.11)
RDW: 16 % — ABNORMAL HIGH (ref 11.5–15.5)
WBC: 8.1 10*3/uL (ref 4.0–10.5)

## 2019-11-24 LAB — LIPID PANEL
Cholesterol: 105 mg/dL (ref 0–200)
HDL: 51.1 mg/dL (ref >39.00)
LDL Cholesterol: 44 mg/dL (ref 0–99)
NonHDL: 53.41
Total CHOL/HDL Ratio: 2
Triglycerides: 48 mg/dL (ref 0.0–149.0)
VLDL: 9.6 mg/dL (ref 0.0–40.0)

## 2019-11-24 LAB — HEMOGLOBIN A1C: Hgb A1c MFr Bld: 5.7 % (ref 4.6–6.5)

## 2019-11-24 MED ORDER — TRAZODONE HCL 50 MG PO TABS: ORAL_TABLET | ORAL | 3 refills | Status: DC

## 2019-11-24 MED ORDER — ALPRAZOLAM 0.25 MG PO TABS: 0.2500 mg | ORAL_TABLET | Freq: Three times a day (TID) | ORAL | 0 refills | Status: DC | PRN

## 2019-11-24 MED FILL — traZODone HCL 50 MG TABS: 50 | 30 days supply | Qty: 30 | Fill #0

## 2019-11-24 MED FILL — ALPRAZolam 0.25 MG TABS: 0.25 | 10 days supply | Qty: 30 | Fill #0

## 2019-11-24 NOTE — Assessment & Plan Note (Addendum)
a1c today Encourage regular exercise Low sugar/carbohydrate diet

## 2019-11-24 NOTE — Assessment & Plan Note (Signed)
Chronic Controlled, stable Continue current dose of medication-trazodone 50 mg daily

## 2019-11-24 NOTE — Assessment & Plan Note (Addendum)
Chronic Controlled, stable Continue current dose of medication Sertraline 150 mg daily Xanax prn - takes occasionally

## 2019-11-24 NOTE — Assessment & Plan Note (Signed)
Chronic Has generalized anxiety, but has had panic attacks on occasion Rarely takes Xanax, but she would like to have on hand [place] controlled substance database reviewed Refill sent to pharmacy

## 2019-11-24 NOTE — Assessment & Plan Note (Addendum)
Chronic GERD controlled Continue daily medication Nexium 40 mg daily 

## 2019-11-24 NOTE — Assessment & Plan Note (Signed)
Chronic Anxiety and depression controlled, stable Continue current dose of medication-sertraline 150 mg daily, Xanax as needed for panic attacks Follow-up in 6 months

## 2019-11-25 ENCOUNTER — Encounter: Payer: Self-pay | Admitting: Internal Medicine

## 2019-12-21 MED FILL — traZODone HCL 50 MG TABS: 50 | 30 days supply | Qty: 30 | Fill #1

## 2019-12-21 MED FILL — SERTRALINE HCL 100 MG TAB: 100 | 60 days supply | Qty: 90 | Fill #2

## 2019-12-21 MED FILL — SPIRONOLACTONE 50 MG TABLET: 50 | 30 days supply | Qty: 60 | Fill #4

## 2020-01-17 ENCOUNTER — Encounter: Payer: Self-pay | Admitting: Internal Medicine

## 2020-01-17 ENCOUNTER — Telehealth (INDEPENDENT_AMBULATORY_CARE_PROVIDER_SITE_OTHER): Payer: No Typology Code available for payment source | Admitting: Internal Medicine

## 2020-01-17 DIAGNOSIS — L559 Sunburn, unspecified: Secondary | ICD-10-CM | POA: Insufficient documentation

## 2020-01-17 MED ORDER — PREDNISONE 10 MG PO TABS: 30.0000 mg | ORAL_TABLET | Freq: Every day | ORAL | 0 refills | Status: DC

## 2020-01-17 MED FILL — predniSONE 10 MG TABS: 10 | 5 days supply | Qty: 15 | Fill #0
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
## 2020-01-17 NOTE — Assessment & Plan Note (Signed)
Acute Moderate in nature From neck to toes, front and back Taking advil, applying vinegar Will prescribe prednisone 30 mg x 5 days Note for work - out 5/3-5/4, RTW 5/5 if feeling up to it if not she will let us know

## 2020-03-31 ENCOUNTER — Other Ambulatory Visit: Payer: Self-pay | Admitting: Internal Medicine

## 2020-03-31 MED FILL — traZODone HCL 50 MG TABS: 50 | 30 days supply | Qty: 30 | Fill #0

## 2020-04-04 ENCOUNTER — Other Ambulatory Visit: Payer: Self-pay | Admitting: Internal Medicine

## 2020-04-04 MED ORDER — ALPRAZOLAM 0.25 MG PO TABS: 0.2500 mg | ORAL_TABLET | Freq: Three times a day (TID) | ORAL | 0 refills | Status: DC | PRN

## 2020-04-04 MED FILL — ALPRAZolam 0.25 MG TABS: 0.25 | 10 days supply | Qty: 30 | Fill #0

## 2020-04-04 NOTE — Telephone Encounter (Signed)
MD is out of the office pls advise on refill../lmb 

## 2020-04-04 NOTE — Telephone Encounter (Signed)
Done erx 

## 2020-04-21 ENCOUNTER — Other Ambulatory Visit (HOSPITAL_COMMUNITY): Payer: Self-pay | Admitting: *Deleted

## 2020-04-21 MED ORDER — FLUCONAZOLE 150 MG PO TABS: 150.0000 mg | ORAL_TABLET | Freq: Every day | ORAL | 0 refills | Status: DC

## 2020-04-21 MED FILL — FLUCONAZOLE 150 MG TABS: 150 | 2 days supply | Qty: 2 | Fill #0

## 2020-05-24 ENCOUNTER — Encounter: Payer: Self-pay | Admitting: Internal Medicine

## 2020-05-24 ENCOUNTER — Other Ambulatory Visit: Payer: Self-pay | Admitting: Internal Medicine

## 2020-05-24 MED ORDER — SERTRALINE HCL 100 MG PO TABS: 150.0000 mg | ORAL_TABLET | Freq: Every day | ORAL | 3 refills | Status: DC

## 2020-05-24 MED FILL — traZODone HCL 50 MG TABS: 50 | 30 days supply | Qty: 30 | Fill #1

## 2020-05-24 MED FILL — SPIRONOLACTONE 50 MG TABS: 50 | 30 days supply | Qty: 60 | Fill #5

## 2020-05-25 MED FILL — SERTRALINE HCL 100 MG TAB: 100 | 60 days supply | Qty: 90 | Fill #0

## 2020-05-28 ENCOUNTER — Ambulatory Visit (INDEPENDENT_AMBULATORY_CARE_PROVIDER_SITE_OTHER): Payer: No Typology Code available for payment source

## 2020-05-28 ENCOUNTER — Other Ambulatory Visit: Payer: Self-pay

## 2020-05-28 ENCOUNTER — Encounter: Payer: Self-pay | Admitting: Emergency Medicine

## 2020-05-28 ENCOUNTER — Ambulatory Visit
Admission: EM | Admit: 2020-05-28 | Discharge: 2020-05-28 | Disposition: A | Discharge status: Home / Self Care | Payer: No Typology Code available for payment source | Attending: Emergency Medicine | Admitting: Emergency Medicine

## 2020-05-28 DIAGNOSIS — W19XXXA Unspecified fall, initial encounter: Secondary | ICD-10-CM | POA: Diagnosis not present

## 2020-05-28 DIAGNOSIS — M79602 Pain in left arm: Secondary | ICD-10-CM | POA: Diagnosis not present

## 2020-05-28 DIAGNOSIS — M79632 Pain in left forearm: Secondary | ICD-10-CM | POA: Diagnosis not present

## 2020-05-28 NOTE — ED Triage Notes (Signed)
Pain to LT arm from fingers to shoulder after slipping and falling today

## 2020-05-28 NOTE — Discharge Instructions (Addendum)
Take OTC Tylenol/ibuprofen as needed for pain Follow RICE instructions as attached Follow-up with PCP Return or go to ED for worsening of symptoms
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
## 2020-07-12 ENCOUNTER — Other Ambulatory Visit: Payer: Self-pay | Admitting: Internal Medicine

## 2020-07-12 MED ORDER — ALPRAZOLAM 0.25 MG PO TABS: 0.2500 mg | ORAL_TABLET | Freq: Three times a day (TID) | ORAL | 0 refills | Status: DC | PRN

## 2020-07-13 MED FILL — ALPRAZolam 0.25 MG TABS: 0.25 | 10 days supply | Qty: 30 | Fill #0

## 2020-08-07 ENCOUNTER — Other Ambulatory Visit (HOSPITAL_COMMUNITY): Payer: Self-pay | Admitting: *Deleted

## 2020-08-07 ENCOUNTER — Other Ambulatory Visit (HOSPITAL_COMMUNITY): Payer: Self-pay | Admitting: Internal Medicine

## 2020-08-07 MED ORDER — FLUCONAZOLE 150 MG PO TABS: 150.0000 mg | ORAL_TABLET | Freq: Every day | ORAL | 0 refills | Status: DC

## 2020-08-07 MED FILL — FLUCONAZOLE 150 MG TABS: 150 | 2 days supply | Qty: 2 | Fill #0

## 2020-08-17 ENCOUNTER — Encounter: Payer: Self-pay | Admitting: Internal Medicine

## 2020-08-18 ENCOUNTER — Other Ambulatory Visit (HOSPITAL_COMMUNITY): Payer: Self-pay | Admitting: Internal Medicine

## 2020-08-18 ENCOUNTER — Other Ambulatory Visit (HOSPITAL_COMMUNITY): Payer: Self-pay | Admitting: *Deleted

## 2020-08-18 MED ORDER — FLUCONAZOLE 150 MG PO TABS: 150.0000 mg | ORAL_TABLET | Freq: Every day | ORAL | 0 refills | Status: DC

## 2020-08-18 MED ORDER — TRAZODONE HCL 50 MG PO TABS: ORAL_TABLET | ORAL | 1 refills | Status: DC

## 2020-08-18 MED FILL — traZODone HCL 50 MG TABS: 50 | 90 days supply | Qty: 90 | Fill #0

## 2020-08-18 MED FILL — FLUCONAZOLE 150 MG TABS: 150 | 2 days supply | Qty: 2 | Fill #0

## 2020-08-18 MED FILL — SPIRONOLACTONE 50 MG TABS: 50 | 30 days supply | Qty: 60 | Fill #0

## 2020-08-18 MED FILL — SERTRALINE HCL 100 MG TAB: 100 | 60 days supply | Qty: 90 | Fill #1
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
## 2020-09-06 ENCOUNTER — Encounter: Payer: Self-pay | Admitting: Internal Medicine

## 2020-09-06 ENCOUNTER — Other Ambulatory Visit: Payer: Self-pay | Admitting: Internal Medicine

## 2020-09-06 ENCOUNTER — Ambulatory Visit (INDEPENDENT_AMBULATORY_CARE_PROVIDER_SITE_OTHER): Payer: No Typology Code available for payment source | Admitting: Internal Medicine

## 2020-09-06 ENCOUNTER — Other Ambulatory Visit: Payer: Self-pay

## 2020-09-06 VITALS — BP 102/72 | HR 93 | Temp 98.3°F | Ht 63.0 in | Wt 155.0 lb

## 2020-09-06 DIAGNOSIS — R109 Unspecified abdominal pain: Secondary | ICD-10-CM | POA: Diagnosis not present

## 2020-09-06 DIAGNOSIS — R197 Diarrhea, unspecified: Secondary | ICD-10-CM | POA: Insufficient documentation

## 2020-09-06 LAB — CBC WITH DIFFERENTIAL/PLATELET
Basophils Absolute: 0 10*3/uL (ref 0.0–0.1)
Basophils Relative: 0.6 % (ref 0.0–3.0)
Eosinophils Absolute: 0.1 10*3/uL (ref 0.0–0.7)
Eosinophils Relative: 1 % (ref 0.0–5.0)
HCT: 41.2 % (ref 36.0–46.0)
Hemoglobin: 13.8 g/dL (ref 12.0–15.0)
Lymphocytes Relative: 23.5 % (ref 12.0–46.0)
Lymphs Abs: 1.9 10*3/uL (ref 0.7–4.0)
MCHC: 33.5 g/dL (ref 30.0–36.0)
MCV: 87 fl (ref 78.0–100.0)
Monocytes Absolute: 0.3 10*3/uL (ref 0.1–1.0)
Monocytes Relative: 3.7 % (ref 3.0–12.0)
Neutro Abs: 5.8 10*3/uL (ref 1.4–7.7)
Neutrophils Relative %: 71.2 % (ref 43.0–77.0)
Platelets: 266 10*3/uL (ref 150.0–400.0)
RBC: 4.73 Mil/uL (ref 3.87–5.11)
RDW: 13.7 % (ref 11.5–15.5)
WBC: 8.1 10*3/uL (ref 4.0–10.5)

## 2020-09-06 LAB — COMPREHENSIVE METABOLIC PANEL
ALT: 18 U/L (ref 0–35)
AST: 15 U/L (ref 0–37)
Albumin: 4.3 g/dL (ref 3.5–5.2)
Alkaline Phosphatase: 72 U/L (ref 39–117)
BUN: 15 mg/dL (ref 6–23)
CO2: 28 mEq/L (ref 19–32)
Calcium: 9.2 mg/dL (ref 8.4–10.5)
Chloride: 104 mEq/L (ref 96–112)
Creatinine, Ser: 0.85 mg/dL (ref 0.40–1.20)
GFR: 86.09 mL/min (ref >60.00)
Glucose, Bld: 103 mg/dL — ABNORMAL HIGH (ref 70–99)
Potassium: 4.3 mEq/L (ref 3.5–5.1)
Sodium: 137 mEq/L (ref 135–145)
Total Bilirubin: 0.5 mg/dL (ref 0.2–1.2)
Total Protein: 7.3 g/dL (ref 6.0–8.3)

## 2020-09-06 LAB — TSH: TSH: 2.2 u[IU]/mL (ref 0.35–4.50)

## 2020-09-06 MED ORDER — HYOSCYAMINE SULFATE 0.125 MG PO TABS: 0.1250 mg | ORAL_TABLET | ORAL | 0 refills | Status: DC | PRN

## 2020-09-06 MED FILL — OSCIMIN 0.125 MG TABLET: 0.125 | 5 days supply | Qty: 30 | Fill #0

## 2020-09-06 NOTE — Assessment & Plan Note (Signed)
Acute on chronic Has had intermittent diarrhea for a while, now more consistent and daily, several times a day. Associated with abd cramping No alarm symptoms Refer to GI Can try to adjust diet and see if any foods are associated with symptoms Try adding metamucil - ? Getting enough fiber Trial of levsin prn - levsin 0.125 mg Q 4 hrs prn for cramping Pepto/imodium only prn Check labs cbc, cmp, tsh

## 2020-09-06 NOTE — Patient Instructions (Addendum)
Have blood work today.   Try taking metamucil daily.  Try the levsin as needed for the cramping.     A referral was ordered for GI.     Low-FODMAP Eating Plan  FODMAPs (fermentable oligosaccharides, disaccharides, monosaccharides, and polyols) are sugars that are hard for some people to digest. A low-FODMAP eating plan may help some people who have bowel (intestinal) diseases to manage their symptoms. This meal plan can be complicated to follow. Work with a diet and nutrition specialist (dietitian) to make a low-FODMAP eating plan that is right for you. A dietitian can make sure that you get enough nutrition from this diet. What are tips for following this plan? Reading food labels  Check labels for hidden FODMAPs such as: ? High-fructose syrup. ? Honey. ? Agave. ? Natural fruit flavors. ? Onion or garlic powder.  Choose low-FODMAP foods that contain 3-4 grams of fiber per serving.  Check food labels for serving sizes. Eat only one serving at a time to make sure FODMAP levels stay low. Meal planning  Follow a low-FODMAP eating plan for up to 6 weeks, or as told by your health care provider or dietitian.  To follow the eating plan: 1. Eliminate high-FODMAP foods from your diet completely. 2. Gradually reintroduce high-FODMAP foods into your diet one at a time. Most people should wait a few days after introducing one high-FODMAP food before they introduce the next high-FODMAP food. Your dietitian can recommend how quickly you may reintroduce foods. 3. Keep a daily record of what you eat and drink, and make note of any symptoms that you have after eating. 4. Review your daily record with a dietitian regularly. Your dietitian can help you identify which foods you can eat and which foods you should avoid. General tips  Drink enough fluid each day to keep your urine pale yellow.  Avoid processed foods. These often have added sugar and may be high in FODMAPs.  Avoid most dairy  products, whole grains, and sweeteners.  Work with a dietitian to make sure you get enough fiber in your diet. Recommended foods Grains  Gluten-free grains, such as rice, oats, buckwheat, quinoa, corn, polenta, and millet. Gluten-free pasta, bread, or cereal. Rice noodles. Corn tortillas. Vegetables  Eggplant, zucchini, cucumber, peppers, green beans, Brussels sprouts, bean sprouts, lettuce, arugula, kale, Swiss chard, spinach, collard greens, bok choy, summer squash, potato, and tomato. Limited amounts of corn, carrot, and sweet potato. Green parts of scallions. Fruits  Bananas, oranges, lemons, limes, blueberries, raspberries, strawberries, grapes, cantaloupe, honeydew melon, kiwi, papaya, passion fruit, and pineapple. Limited amounts of dried cranberries, banana chips, and shredded coconut. Dairy  Lactose-free milk, yogurt, and kefir. Lactose-free cottage cheese and ice cream. Non-dairy milks, such as almond, coconut, hemp, and rice milk. Yogurts made of non-dairy milks. Limited amounts of goat cheese, brie, mozzarella, parmesan, swiss, and other hard cheeses. Meats and other protein foods  Unseasoned beef, pork, poultry, or fish. Eggs. Berniece Salines. Tofu (firm) and tempeh. Limited amounts of nuts and seeds, such as almonds, walnuts, [place] nuts, pecans, peanuts, pumpkin seeds, chia seeds, and sunflower seeds. Fats and oils  Butter-free spreads. Vegetable oils, such as olive, canola, and sunflower oil. Seasoning and other foods  Artificial sweeteners with names that do not end in "ol" such as aspartame, saccharine, and stevia. Maple syrup, white table sugar, raw sugar, brown sugar, and molasses. Fresh basil, coriander, parsley, rosemary, and thyme. Beverages  Water and mineral water. Sugar-sweetened soft drinks. Small amounts of orange juice or  cranberry juice. Black and green tea. Most dry wines. Coffee. This may not be a complete list of low-FODMAP foods. Talk with your dietitian for more  information. Foods to avoid Grains  Wheat, including kamut, durum, and semolina. Barley and bulgur. Couscous. Wheat-based cereals. Wheat noodles, bread, crackers, and pastries. Vegetables  Chicory root, artichoke, asparagus, cabbage, snow peas, sugar snap peas, mushrooms, and cauliflower. Onions, garlic, leeks, and the white part of scallions. Fruits  Fresh, dried, and juiced forms of apple, pear, watermelon, peach, plum, cherries, apricots, blackberries, boysenberries, figs, nectarines, and mango. Avocado. Dairy  Milk, yogurt, ice cream, and soft cheese. Cream and sour cream. Milk-based sauces. Custard. Meats and other protein foods  Fried or fatty meat. Sausage. Cashews and pistachios. Soybeans, baked beans, black beans, chickpeas, kidney beans, fava beans, navy beans, lentils, and split peas. Seasoning and other foods  Any sugar-free gum or candy. Foods that contain artificial sweeteners such as sorbitol, mannitol, isomalt, or xylitol. Foods that contain honey, high-fructose corn syrup, or agave. Bouillon, vegetable stock, beef stock, and chicken stock. Garlic and onion powder. Condiments made with onion, such as hummus, chutney, pickles, relish, salad dressing, and salsa. Tomato paste. Beverages  Chicory-based drinks. Coffee substitutes. Chamomile tea. Fennel tea. Sweet or fortified wines such as port or sherry. Diet soft drinks made with isomalt, mannitol, maltitol, sorbitol, or xylitol. Apple, pear, and mango juice. Juices with high-fructose corn syrup. This may not be a complete list of high-FODMAP foods. Talk with your dietitian to discuss what dietary choices are best for you.  Summary  A low-FODMAP eating plan is a short-term diet that eliminates FODMAPs from your diet to help ease symptoms of certain bowel diseases.  The eating plan usually lasts up to 6 weeks. After that, high-FODMAP foods are restarted gradually, one at a time, so you can find out which may be causing  symptoms.  A low-FODMAP eating plan can be complicated. It is best to work with a dietitian who has experience with this type of plan. This information is not intended to replace advice given to you by your health care provider. Make sure you discuss any questions you have with your health care provider. Document Revised: 08/15/2017 Document Reviewed: 04/29/2017 Elsevier Patient Education  [place].

## 2020-10-10 DIAGNOSIS — K219 Gastro-esophageal reflux disease without esophagitis: Secondary | ICD-10-CM | POA: Insufficient documentation

## 2020-10-10 DIAGNOSIS — L709 Acne, unspecified: Secondary | ICD-10-CM | POA: Insufficient documentation

## 2020-10-18 ENCOUNTER — Encounter: Payer: Self-pay | Admitting: Physician Assistant

## 2020-10-18 ENCOUNTER — Ambulatory Visit: Payer: No Typology Code available for payment source | Admitting: Physician Assistant

## 2020-10-18 ENCOUNTER — Encounter: Payer: Self-pay | Admitting: Gastroenterology

## 2020-10-18 ENCOUNTER — Other Ambulatory Visit: Payer: Self-pay

## 2020-10-18 VITALS — BP 94/70 | HR 76 | Ht 63.0 in | Wt 155.2 lb

## 2020-10-18 DIAGNOSIS — R1030 Lower abdominal pain, unspecified: Secondary | ICD-10-CM

## 2020-10-18 DIAGNOSIS — R194 Change in bowel habit: Secondary | ICD-10-CM

## 2020-10-18 NOTE — Patient Instructions (Signed)
If you are age 40 or older, your body mass index should be between 23-30. Your Body mass index is 27.5 kg/m. If this is out of the aforementioned range listed, please consider follow up with your Primary Care Provider.  If you are age 72 or younger, your body mass index should be between 19-25. Your Body mass index is 27.5 kg/m. If this is out of the aformentioned range listed, please consider follow up with your Primary Care Provider.   You have been scheduled for a colonoscopy. Please follow written instructions given to you at your visit today.  Please pick up your prep supplies at the pharmacy within the next 1-3 days. If you use inhalers (even only as needed), please bring them with you on the day of your procedure.  Thank you for choosing me and [facility].  Ellouise Newer, PA-C
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
## 2020-10-20 ENCOUNTER — Encounter: Payer: Self-pay | Admitting: Internal Medicine

## 2020-10-20 ENCOUNTER — Ambulatory Visit (AMBULATORY_SURGERY_CENTER): Payer: No Typology Code available for payment source | Admitting: Internal Medicine

## 2020-10-20 ENCOUNTER — Other Ambulatory Visit: Payer: Self-pay

## 2020-10-20 VITALS — BP 101/65 | HR 78 | Temp 97.1°F | Resp 10 | Ht 63.0 in | Wt 155.0 lb

## 2020-10-20 DIAGNOSIS — R1030 Lower abdominal pain, unspecified: Secondary | ICD-10-CM

## 2020-10-20 DIAGNOSIS — R194 Change in bowel habit: Secondary | ICD-10-CM

## 2020-10-20 DIAGNOSIS — R195 Other fecal abnormalities: Secondary | ICD-10-CM

## 2020-10-20 DIAGNOSIS — K635 Polyp of colon: Secondary | ICD-10-CM

## 2020-10-20 DIAGNOSIS — D122 Benign neoplasm of ascending colon: Secondary | ICD-10-CM

## 2020-10-20 MED ORDER — SODIUM CHLORIDE 0.9 % IV SOLN: 500.0000 mL | Freq: Once | INTRAVENOUS | Status: DC

## 2020-10-20 NOTE — Op Note (Signed)
[facility] Patient Name: [name] Procedure Date: 10/20/2020 10:53 AM MRN: [id number] Endoscopist: Jerene Bears , MD Age: 40 Referring MD:  Date of Birth: [birth year] Gender: Female Account #: [id number] Procedure:                Colonoscopy Indications:              Lower abdominal pain, Change in bowel habits with                            loose stools Medicines:                Monitored Anesthesia Care Procedure:                Pre-Anesthesia Assessment:                           - Prior to the procedure, a History and Physical                            was performed, and patient medications and                            allergies were reviewed. The patient's tolerance of                            previous anesthesia was also reviewed. The risks                            and benefits of the procedure and the sedation                            options and risks were discussed with the patient.                            All questions were answered, and informed consent                            was obtained. Prior Anticoagulants: The patient has                            taken no previous anticoagulant or antiplatelet                            agents. ASA Grade Assessment: II - A patient with                            mild systemic disease. After reviewing the risks                            and benefits, the patient was deemed in                            satisfactory condition to undergo the procedure.  After obtaining informed consent, the colonoscope                            was passed under direct vision. Throughout the                            procedure, the patient's blood pressure, pulse, and                            oxygen saturations were monitored continuously. The                            Olympus PCF-H190DL (#[id number]) Colonoscope was                            introduced through the anus and advanced to the                             terminal ileum. The colonoscopy was performed                            without difficulty. The patient tolerated the                            procedure well. The quality of the bowel                            preparation was excellent. The terminal ileum,                            ileocecal valve, appendiceal orifice, and rectum                            were photographed. Scope In: 11:30:56 AM Scope Out: 11:43:57 AM Scope Withdrawal Time: 0 hours 11 minutes 18 seconds  Total Procedure Duration: 0 hours 13 minutes 1 second  Findings:                 The digital rectal exam was normal.                           The terminal ileum appeared normal. Biopsies were                            taken with a cold forceps for histology.                           A 7 mm polyp was found in the ascending colon. The                            polyp was sessile. The polyp was removed with a                            cold snare. Resection and retrieval were complete.  The exam was otherwise without abnormality on                            direct and retroflexion views.                           Biopsies for histology were taken with a cold                            forceps from the right colon and left colon for                            evaluation of microscopic colitis. Complications:            No immediate complications. Estimated Blood Loss:     Estimated blood loss was minimal. Impression:               - The examined portion of the ileum was normal.                            Biopsied.                           - One 7 mm polyp in the ascending colon, removed                            with a cold snare. Resected and retrieved.                           - The examination was otherwise normal on direct                            and retroflexion views.                           - Biopsies were taken with a cold forceps from the                             right colon and left colon for evaluation of                            microscopic colitis. Recommendation:           - Patient has a contact number available for                            emergencies. The signs and symptoms of potential                            delayed complications were discussed with the                            patient. Return to normal activities tomorrow.                            Written  discharge instructions were provided to the                            patient.                           - Resume previous diet.                           - Continue present medications.                           - Await pathology results. If pathology results                            unrevealing for etiology of loose stools with lower                            abdominal pain would consider treatment with                            rifaximin 550 mg TID x 14 days for IBS.                           - Repeat colonoscopy is recommended. The                            colonoscopy date will be determined after pathology                            results from today's exam become available for                            review. Jerene Bears, MD 10/20/2020 11:48:41 AM This report has been signed electronically.

## 2020-10-20 NOTE — Patient Instructions (Signed)
Handout given for polyps.  Await pathology results.  YOU HAD AN ENDOSCOPIC PROCEDURE TODAY AT THE [facility]:   Refer to the procedure report that was given to you for any specific questions about what was found during the examination.  If the procedure report does not answer your questions, please call your gastroenterologist to clarify.  If you requested that your care partner not be given the details of your procedure findings, then the procedure report has been included in a sealed envelope for you to review at your convenience later.  YOU SHOULD EXPECT: Some feelings of bloating in the abdomen. Passage of more gas than usual.  Walking can help get rid of the air that was put into your GI tract during the procedure and reduce the bloating. If you had a lower endoscopy (such as a colonoscopy or flexible sigmoidoscopy) you may notice spotting of blood in your stool or on the toilet paper. If you underwent a bowel prep for your procedure, you may not have a normal bowel movement for a few days.  Please Note:  You might notice some irritation and congestion in your nose or some drainage.  This is from the oxygen used during your procedure.  There is no need for concern and it should clear up in a day or so.  SYMPTOMS TO REPORT IMMEDIATELY:   Following lower endoscopy (colonoscopy or flexible sigmoidoscopy):  Excessive amounts of blood in the stool  Significant tenderness or worsening of abdominal pains  Swelling of the abdomen that is new, acute  Fever of 100F or higher  For urgent or emergent issues, a gastroenterologist can be reached at any hour by calling [phone]. Do not use MyChart messaging for urgent concerns.    DIET:  We do recommend a small meal at first, but then you may proceed to your regular diet.  Drink plenty of fluids but you should avoid alcoholic beverages for 24 hours.  ACTIVITY:  You should plan to take it easy for the rest of today and you should  NOT DRIVE or use heavy machinery until tomorrow (because of the sedation medicines used during the test).    FOLLOW UP: Our staff will call the number listed on your records 48-72 hours following your procedure to check on you and address any questions or concerns that you may have regarding the information given to you following your procedure. If we do not reach you, we will leave a message.  We will attempt to reach you two times.  During this call, we will ask if you have developed any symptoms of COVID 19. If you develop any symptoms (ie: fever, flu-like symptoms, shortness of breath, cough etc.) before then, please call [phone].  If you test positive for Covid 19 in the 2 weeks post procedure, please call and report this information to us.    If any biopsies were taken you will be contacted by phone or by letter within the next 1-3 weeks.  Please call us at [phone] if you have not heard about the biopsies in 3 weeks.    SIGNATURES/CONFIDENTIALITY: You and/or your care partner have signed paperwork which will be entered into your electronic medical record.  These signatures attest to the fact that that the information above on your After Visit Summary has been reviewed and is understood.  Full responsibility of the confidentiality of this discharge information lies with you and/or your care-partner. 

## 2020-10-20 NOTE — Progress Notes (Signed)
VS by CW. ?

## 2020-10-20 NOTE — Progress Notes (Signed)
Agree with Colonoscopy with biopsies to exclude microscopic colitis or IBD for evaluation of ongoing symptoms Reviewed and agree with documentation and assessment and plan.  Damaris Hippo , MD

## 2020-10-20 NOTE — Progress Notes (Signed)
Called to room to assist during endoscopic procedure.  Patient ID and intended procedure confirmed with present staff. Received instructions for my participation in the procedure from the performing physician.  

## 2020-10-20 NOTE — Progress Notes (Signed)
A and O x3. Report to RN. Tolerated MAC anesthesia well.

## 2020-10-24 ENCOUNTER — Telehealth: Payer: Self-pay | Admitting: *Deleted

## 2020-10-24 NOTE — Telephone Encounter (Signed)
  Follow up Call-  Call back number 10/20/2020  Post procedure Call Back phone  # [phone]  Permission to leave phone message Yes  Some recent data might be hidden     Patient questions:  Do you have a fever, pain , or abdominal swelling? No. Pain Score  0 *  Have you tolerated food without any problems? Yes.    Have you been able to return to your normal activities? Yes.    Do you have any questions about your discharge instructions: Diet   No. Medications  No. Follow up visit  No.  Do you have questions or concerns about your Care? No.  Actions: * If pain score is 4 or above: No action needed, pain <4.  1. Have you developed a fever since your procedure? no  2.   Have you had an respiratory symptoms (SOB or cough) since your procedure? no  3.   Have you tested positive for COVID 19 since your procedure no  4.   Have you had any family members/close contacts diagnosed with the COVID 19 since your procedure?  no   If yes to any of these questions please route to Joylene John, RN and Joella Prince, RN

## 2020-10-27 ENCOUNTER — Other Ambulatory Visit: Payer: Self-pay

## 2020-10-27 ENCOUNTER — Telehealth: Payer: Self-pay | Admitting: Internal Medicine

## 2020-10-27 MED ORDER — RIFAXIMIN 550 MG PO TABS: 550.0000 mg | ORAL_TABLET | Freq: Three times a day (TID) | ORAL | 0 refills | Status: DC

## 2020-10-27 NOTE — Telephone Encounter (Signed)
See result note.  

## 2020-10-27 NOTE — Telephone Encounter (Signed)
Inbound call from patient returning your call in regards to results.

## 2020-10-31 MED FILL — XIFAXAN 550 MG TABLET: 550 | 14 days supply | Qty: 42 | Fill #0

## 2020-11-02 MED FILL — SERTRALINE HCL 100 MG TAB: 100 | 60 days supply | Qty: 90 | Fill #2

## 2020-11-02 MED FILL — SPIRONOLACTONE 50 MG TABS: 50 | 30 days supply | Qty: 60 | Fill #1

## 2020-11-10 MED FILL — SERTRALINE HCL 100 MG TAB: 100 | 60 days supply | Qty: 90 | Fill #2

## 2020-11-10 MED FILL — XIFAXAN 550 MG TABLET: 550 | 14 days supply | Qty: 42 | Fill #0

## 2020-11-10 MED FILL — SPIRONOLACTONE 50 MG TABS: 50 | 30 days supply | Qty: 60 | Fill #1

## 2021-01-17 ENCOUNTER — Other Ambulatory Visit (HOSPITAL_COMMUNITY): Payer: Self-pay | Admitting: Obstetrics and Gynecology

## 2021-01-17 ENCOUNTER — Other Ambulatory Visit: Payer: Self-pay | Admitting: Internal Medicine

## 2021-01-17 MED FILL — Trazodone HCl Tab 50 MG: ORAL | 90 days supply | Qty: 90 | Fill #0 | Status: AC

## 2021-01-17 MED FILL — Sertraline HCl Tab 100 MG: ORAL | 60 days supply | Qty: 90 | Fill #0 | Status: AC

## 2021-01-18 ENCOUNTER — Other Ambulatory Visit (HOSPITAL_COMMUNITY): Payer: Self-pay

## 2021-01-18 MED ORDER — ALPRAZOLAM 0.25 MG PO TABS
0.2500 mg | ORAL_TABLET | Freq: Three times a day (TID) | ORAL | 0 refills | Status: DC | PRN
  Filled 2021-01-18: qty 30, 10d supply, fill #0

## 2021-01-18 MED ORDER — SPIRONOLACTONE 50 MG PO TABS
50.0000 mg | ORAL_TABLET | Freq: Two times a day (BID) | ORAL | 1 refills | Status: DC
  Filled 2021-01-18: qty 60, 30d supply, fill #0
  Filled 2021-04-22 – 2021-05-02 (×2): qty 60, 30d supply, fill #1

## 2021-02-07 ENCOUNTER — Other Ambulatory Visit (HOSPITAL_COMMUNITY): Payer: Self-pay

## 2021-02-07 MED ORDER — TIZANIDINE HCL 4 MG PO TABS
ORAL_TABLET | ORAL | 2 refills | Status: DC
  Filled 2021-02-07: qty 30, 30d supply, fill #0

## 2021-03-12 NOTE — Patient Instructions (Addendum)
Blood work was ordered.     Medications changes include :  none   Your prescription(s) have been submitted to your pharmacy. Please take as directed and contact our office if you believe you are having problem(s) with the medication(s).    Please followup in 6 months     Health Maintenance, Female Adopting a healthy lifestyle and getting preventive care are important in promoting health and wellness. Ask your health care provider about: The right schedule for you to have regular tests and exams. Things you can do on your own to prevent diseases and keep yourself healthy. What should I know about diet, weight, and exercise? Eat a healthy diet  Eat a diet that includes plenty of vegetables, fruits, low-fat dairy products, and lean protein. Do not eat a lot of foods that are high in solid fats, added sugars, or sodium.  Maintain a healthy weight Body mass index (BMI) is used to identify weight problems. It estimates body fat based on height and weight. Your health care provider can help determineyour BMI and help you achieve or maintain a healthy weight. Get regular exercise Get regular exercise. This is one of the most important things you can do for your health. Most adults should: Exercise for at least 150 minutes each week. The exercise should increase your heart rate and make you sweat (moderate-intensity exercise). Do strengthening exercises at least twice a week. This is in addition to the moderate-intensity exercise. Spend less time sitting. Even light physical activity can be beneficial. Watch cholesterol and blood lipids Have your blood tested for lipids and cholesterol at 40 years of age, then havethis test every 5 years. Have your cholesterol levels checked more often if: Your lipid or cholesterol levels are high. You are older than 40 years of age. You are at high risk for heart disease. What should I know about cancer screening? Depending on your health history and  family history, you may need to have cancer screening at various ages. This may include screening for: Breast cancer. Cervical cancer. Colorectal cancer. Skin cancer. Lung cancer. What should I know about heart disease, diabetes, and high blood pressure? Blood pressure and heart disease High blood pressure causes heart disease and increases the risk of stroke. This is more likely to develop in people who have high blood pressure readings, are of African descent, or are overweight. Have your blood pressure checked: Every 3-5 years if you are 24-18 years of age. Every year if you are 36 years old or older. Diabetes Have regular diabetes screenings. This checks your fasting blood sugar level. Have the screening done: Once every three years after age 54 if you are at a normal weight and have a low risk for diabetes. More often and at a younger age if you are overweight or have a high risk for diabetes. What should I know about preventing infection? Hepatitis B If you have a higher risk for hepatitis B, you should be screened for this virus. Talk with your health care provider to find out if you are at risk forhepatitis B infection. Hepatitis C Testing is recommended for: Everyone born from [birth year] through 1965. Anyone with known risk factors for hepatitis C. Sexually transmitted infections (STIs) Get screened for STIs, including gonorrhea and chlamydia, if: You are sexually active and are younger than 40 years of age. You are older than 40 years of age and your health care provider tells you that you are at risk for this type of infection. Your  sexual activity has changed since you were last screened, and you are at increased risk for chlamydia or gonorrhea. Ask your health care provider if you are at risk. Ask your health care provider about whether you are at high risk for HIV. Your health care provider may recommend a prescription medicine to help prevent HIV infection. If you choose to take  medicine to prevent HIV, you should first get tested for HIV. You should then be tested every 3 months for as long as you are taking the medicine. Pregnancy If you are about to stop having your period (premenopausal) and you may become pregnant, seek counseling before you get pregnant. Take 400 to 800 micrograms (mcg) of folic acid every day if you become pregnant. Ask for birth control (contraception) if you want to prevent pregnancy. Osteoporosis and menopause Osteoporosis is a disease in which the bones lose minerals and strength with aging. This can result in bone fractures. If you are 80 years old or older, or if you are at risk for osteoporosis and fractures, ask your health care provider if you should: Be screened for bone loss. Take a calcium or vitamin D supplement to lower your risk of fractures. Be given hormone replacement therapy (HRT) to treat symptoms of menopause. Follow these instructions at home: Lifestyle Do not use any products that contain nicotine or tobacco, such as cigarettes, e-cigarettes, and chewing tobacco. If you need help quitting, ask your health care provider. Do not use street drugs. Do not share needles. Ask your health care provider for help if you need support or information about quitting drugs. Alcohol use Do not drink alcohol if: Your health care provider tells you not to drink. You are pregnant, may be pregnant, or are planning to become pregnant. If you drink alcohol: Limit how much you use to 0-1 drink a day. Limit intake if you are breastfeeding. Be aware of how much alcohol is in your drink. In the [place] one drink equals one 12 oz bottle of beer (355 mL), one 5 oz glass of wine (148 mL), or one 1 oz glass of hard liquor (44 mL). General instructions Schedule regular health, dental, and eye exams. Stay current with your vaccines. Tell your health care provider if: You often feel depressed. You have ever been abused or do not feel safe at  home. Summary Adopting a healthy lifestyle and getting preventive care are important in promoting health and wellness. Follow your health care provider's instructions about healthy diet, exercising, and getting tested or screened for diseases. Follow your health care provider's instructions on monitoring your cholesterol and blood pressure. This information is not intended to replace advice given to you by your health care provider. Make sure you discuss any questions you have with your healthcare provider. Document Revised: 08/26/2018 Document Reviewed: 08/26/2018 Elsevier Patient Education  2022 [employer].
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
## 2021-03-13 ENCOUNTER — Other Ambulatory Visit (HOSPITAL_COMMUNITY): Payer: Self-pay

## 2021-03-13 ENCOUNTER — Encounter: Payer: Self-pay | Admitting: Internal Medicine

## 2021-03-13 ENCOUNTER — Other Ambulatory Visit: Payer: Self-pay

## 2021-03-13 ENCOUNTER — Ambulatory Visit (INDEPENDENT_AMBULATORY_CARE_PROVIDER_SITE_OTHER): Payer: No Typology Code available for payment source | Admitting: Internal Medicine

## 2021-03-13 VITALS — BP 118/64 | HR 79 | Ht 63.0 in | Wt 154.0 lb

## 2021-03-13 DIAGNOSIS — F410 Panic disorder [episodic paroxysmal anxiety] without agoraphobia: Secondary | ICD-10-CM

## 2021-03-13 DIAGNOSIS — Z0000 Encounter for general adult medical examination without abnormal findings: Secondary | ICD-10-CM | POA: Diagnosis not present

## 2021-03-13 DIAGNOSIS — K580 Irritable bowel syndrome with diarrhea: Secondary | ICD-10-CM

## 2021-03-13 DIAGNOSIS — K219 Gastro-esophageal reflux disease without esophagitis: Secondary | ICD-10-CM

## 2021-03-13 DIAGNOSIS — G4709 Other insomnia: Secondary | ICD-10-CM

## 2021-03-13 DIAGNOSIS — K589 Irritable bowel syndrome without diarrhea: Secondary | ICD-10-CM | POA: Insufficient documentation

## 2021-03-13 DIAGNOSIS — F32A Depression, unspecified: Secondary | ICD-10-CM

## 2021-03-13 DIAGNOSIS — F419 Anxiety disorder, unspecified: Secondary | ICD-10-CM

## 2021-03-13 LAB — CBC WITH DIFFERENTIAL/PLATELET
Basophils Absolute: 0.1 10*3/uL (ref 0.0–0.1)
Basophils Relative: 0.6 % (ref 0.0–3.0)
Eosinophils Absolute: 0.1 10*3/uL (ref 0.0–0.7)
Eosinophils Relative: 1.2 % (ref 0.0–5.0)
HCT: 37.2 % (ref 36.0–46.0)
Hemoglobin: 12.4 g/dL (ref 12.0–15.0)
Lymphocytes Relative: 26.7 % (ref 12.0–46.0)
Lymphs Abs: 2.4 10*3/uL (ref 0.7–4.0)
MCHC: 33.4 g/dL (ref 30.0–36.0)
MCV: 81.5 fl (ref 78.0–100.0)
Monocytes Absolute: 0.4 10*3/uL (ref 0.1–1.0)
Monocytes Relative: 4.4 % (ref 3.0–12.0)
Neutro Abs: 6.1 10*3/uL (ref 1.4–7.7)
Neutrophils Relative %: 67.1 % (ref 43.0–77.0)
Platelets: 311 10*3/uL (ref 150.0–400.0)
RBC: 4.56 Mil/uL (ref 3.87–5.11)
RDW: 14.2 % (ref 11.5–15.5)
WBC: 9.1 10*3/uL (ref 4.0–10.5)

## 2021-03-13 LAB — LIPID PANEL
Cholesterol: 120 mg/dL (ref 0–200)
HDL: 50.4 mg/dL (ref >39.00)
LDL Cholesterol: 45 mg/dL (ref 0–99)
NonHDL: 69.61
Total CHOL/HDL Ratio: 2
Triglycerides: 122 mg/dL (ref 0.0–149.0)
VLDL: 24.4 mg/dL (ref 0.0–40.0)

## 2021-03-13 LAB — COMPREHENSIVE METABOLIC PANEL
ALT: 13 U/L (ref 0–35)
AST: 14 U/L (ref 0–37)
Albumin: 4.7 g/dL (ref 3.5–5.2)
Alkaline Phosphatase: 65 U/L (ref 39–117)
BUN: 15 mg/dL (ref 6–23)
CO2: 27 mEq/L (ref 19–32)
Calcium: 9.5 mg/dL (ref 8.4–10.5)
Chloride: 103 mEq/L (ref 96–112)
Creatinine, Ser: 1.1 mg/dL (ref 0.40–1.20)
GFR: 62.95 mL/min (ref >60.00)
Glucose, Bld: 86 mg/dL (ref 70–99)
Potassium: 4 mEq/L (ref 3.5–5.1)
Sodium: 138 mEq/L (ref 135–145)
Total Bilirubin: 0.3 mg/dL (ref 0.2–1.2)
Total Protein: 7.7 g/dL (ref 6.0–8.3)

## 2021-03-13 LAB — HEMOGLOBIN A1C: Hgb A1c MFr Bld: 5.8 % (ref 4.6–6.5)

## 2021-03-13 MED ORDER — TRIAMCINOLONE ACETONIDE 0.147 MG/GM EX AERS
INHALATION_SPRAY | CUTANEOUS | 2 refills | Status: DC
  Filled 2021-03-13: qty 63, 25d supply, fill #0

## 2021-03-13 MED ORDER — TRIAMCINOLONE ACETONIDE 0.1 % EX CREA
1.0000 "application " | TOPICAL_CREAM | Freq: Two times a day (BID) | CUTANEOUS | 2 refills | Status: DC
  Filled 2021-03-13: qty 30, 15d supply, fill #0

## 2021-03-13 NOTE — Assessment & Plan Note (Signed)
Chronic Slightly increased generalized anxiety secondary to finishing school and working She feels her medication is controlling her anxiety overall Continue sertraline 150 mg daily Continue alprazolam 0.25 mg 3 times daily as needed

## 2021-03-13 NOTE — Assessment & Plan Note (Signed)
Chronic Diarrhea much improved with Xifaxan Has occasional diarrhea and abdominal cramping Takes Levsin as needed, which helps-continue

## 2021-03-13 NOTE — Assessment & Plan Note (Signed)
Chronic Overall controlled Does have occasional nights where she does not sleep well, but this is not that frequent Continue trazodone 50 mg nightly as needed

## 2021-03-13 NOTE — Assessment & Plan Note (Signed)
Chronic Controlled Continue Nexium 40 mg daily

## 2021-03-13 NOTE — Assessment & Plan Note (Signed)
Chronic Some increased anxiety related to finishing school and work, but overall feels depression and anxiety are controlled Continue sertraline 150 mg daily Continue alprazolam 0.25 mg 3 times daily as needed

## 2021-03-14 ENCOUNTER — Other Ambulatory Visit (HOSPITAL_COMMUNITY): Payer: Self-pay

## 2021-04-02 ENCOUNTER — Other Ambulatory Visit (HOSPITAL_COMMUNITY): Payer: Self-pay

## 2021-04-02 ENCOUNTER — Other Ambulatory Visit: Payer: Self-pay | Admitting: Internal Medicine

## 2021-04-02 MED ORDER — SERTRALINE HCL 100 MG PO TABS
150.0000 mg | ORAL_TABLET | Freq: Every day | ORAL | 3 refills | Status: DC
  Filled 2021-04-02: qty 90, 60d supply, fill #0
  Filled 2021-05-26: qty 90, 60d supply, fill #1
  Filled 2021-08-06: qty 90, 60d supply, fill #2
  Filled 2021-10-18: qty 90, 60d supply, fill #3

## 2021-04-23 ENCOUNTER — Other Ambulatory Visit (HOSPITAL_COMMUNITY): Payer: Self-pay

## 2021-05-01 ENCOUNTER — Other Ambulatory Visit (HOSPITAL_COMMUNITY): Payer: Self-pay

## 2021-05-02 ENCOUNTER — Other Ambulatory Visit (HOSPITAL_COMMUNITY): Payer: Self-pay

## 2021-05-26 ENCOUNTER — Other Ambulatory Visit: Payer: Self-pay | Admitting: Internal Medicine

## 2021-05-26 ENCOUNTER — Other Ambulatory Visit (HOSPITAL_COMMUNITY): Payer: Self-pay

## 2021-05-26 ENCOUNTER — Other Ambulatory Visit (HOSPITAL_COMMUNITY): Payer: Self-pay | Admitting: Internal Medicine

## 2021-05-26 MED ORDER — TRAZODONE HCL 50 MG PO TABS
25.0000 mg | ORAL_TABLET | Freq: Every evening | ORAL | 1 refills | Status: DC | PRN
  Filled 2021-05-26: qty 90, 90d supply, fill #0
  Filled 2021-12-17: qty 90, 90d supply, fill #1

## 2021-05-26 MED ORDER — ALPRAZOLAM 0.25 MG PO TABS
0.2500 mg | ORAL_TABLET | Freq: Three times a day (TID) | ORAL | 0 refills | Status: DC | PRN
Start: 2021-05-26 — End: 2021-10-18
  Filled 2021-05-26: qty 30, 10d supply, fill #0

## 2021-05-28 ENCOUNTER — Other Ambulatory Visit (HOSPITAL_COMMUNITY): Payer: Self-pay

## 2021-05-28 MED ORDER — SPIRONOLACTONE 50 MG PO TABS
50.0000 mg | ORAL_TABLET | Freq: Two times a day (BID) | ORAL | 1 refills | Status: DC
  Filled 2021-05-28: qty 60, 30d supply, fill #0
  Filled 2021-07-09: qty 60, 30d supply, fill #1

## 2021-06-18 ENCOUNTER — Other Ambulatory Visit (HOSPITAL_COMMUNITY): Payer: Self-pay

## 2021-06-18 ENCOUNTER — Other Ambulatory Visit (HOSPITAL_COMMUNITY): Payer: Self-pay | Admitting: *Deleted

## 2021-06-18 MED ORDER — FLUCONAZOLE 150 MG PO TABS
150.0000 mg | ORAL_TABLET | Freq: Every day | ORAL | 0 refills | Status: DC
  Filled 2021-06-18: qty 2, 2d supply, fill #0

## 2021-07-09 ENCOUNTER — Telehealth (HOSPITAL_COMMUNITY): Payer: Self-pay

## 2021-07-09 ENCOUNTER — Other Ambulatory Visit (HOSPITAL_COMMUNITY): Payer: Self-pay

## 2021-07-09 MED ORDER — LEVOFLOXACIN 500 MG PO TABS
500.0000 mg | ORAL_TABLET | Freq: Every day | ORAL | 0 refills | Status: DC
  Filled 2021-07-09: qty 14, 14d supply, fill #0

## 2021-07-09 MED ORDER — FLUCONAZOLE 150 MG PO TABS
150.0000 mg | ORAL_TABLET | Freq: Every day | ORAL | 0 refills | Status: DC
  Filled 2021-07-09: qty 5, 5d supply, fill #0

## 2021-07-09 NOTE — Telephone Encounter (Signed)
Pescription sent in as per Dr,> Bensimhon

## 2021-08-01 ENCOUNTER — Encounter: Payer: Self-pay | Admitting: Internal Medicine

## 2021-08-01 DIAGNOSIS — M79605 Pain in left leg: Secondary | ICD-10-CM

## 2021-08-02 ENCOUNTER — Encounter: Payer: Self-pay | Admitting: Family Medicine

## 2021-08-02 ENCOUNTER — Ambulatory Visit: Payer: No Typology Code available for payment source | Admitting: Family Medicine

## 2021-08-02 ENCOUNTER — Ambulatory Visit: Payer: Self-pay

## 2021-08-02 VITALS — BP 108/68 | Ht 63.0 in | Wt 154.0 lb

## 2021-08-02 DIAGNOSIS — M7632 Iliotibial band syndrome, left leg: Secondary | ICD-10-CM | POA: Diagnosis not present

## 2021-08-02 DIAGNOSIS — M25562 Pain in left knee: Secondary | ICD-10-CM

## 2021-08-02 MED ORDER — DICLOFENAC SODIUM 2 % EX SOLN: 1.0000 "application " | Freq: Two times a day (BID) | CUTANEOUS | 2 refills | Status: DC

## 2021-08-02 NOTE — Patient Instructions (Signed)
Nice to meet you Please try ice  Please try the pennsaid  Please try the exercises   Please send me a message in MyChart with any questions or updates.  Please see me back in 4 weeks or as needed if better.   --Dr. Raeford Razor

## 2021-08-02 NOTE — Assessment & Plan Note (Signed)
No effusion on exam and positive Noble's test. -Counseled on home exercise therapy and supportive care. -Green sport insoles. -Pennsaid. -Could consider physical therapy or further imaging.
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
## 2021-08-06 ENCOUNTER — Other Ambulatory Visit (HOSPITAL_COMMUNITY): Payer: Self-pay

## 2021-08-06 ENCOUNTER — Ambulatory Visit: Payer: No Typology Code available for payment source | Admitting: Family Medicine

## 2021-08-07 ENCOUNTER — Other Ambulatory Visit (HOSPITAL_COMMUNITY): Payer: Self-pay

## 2021-08-07 MED ORDER — SPIRONOLACTONE 50 MG PO TABS
50.0000 mg | ORAL_TABLET | Freq: Two times a day (BID) | ORAL | 1 refills | Status: DC
  Filled 2021-08-07: qty 60, 30d supply, fill #0
  Filled 2021-09-24: qty 60, 30d supply, fill #1

## 2021-08-08 ENCOUNTER — Other Ambulatory Visit (HOSPITAL_COMMUNITY): Payer: Self-pay
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
## 2021-09-05 ENCOUNTER — Other Ambulatory Visit: Payer: Self-pay

## 2021-09-05 ENCOUNTER — Encounter: Payer: Self-pay | Admitting: Internal Medicine

## 2021-09-05 ENCOUNTER — Ambulatory Visit (INDEPENDENT_AMBULATORY_CARE_PROVIDER_SITE_OTHER): Payer: No Typology Code available for payment source | Admitting: Internal Medicine

## 2021-09-05 VITALS — BP 106/78 | HR 75 | Temp 97.9°F | Ht 63.0 in | Wt 158.0 lb

## 2021-09-05 DIAGNOSIS — R5383 Other fatigue: Secondary | ICD-10-CM | POA: Diagnosis not present

## 2021-09-05 DIAGNOSIS — G4709 Other insomnia: Secondary | ICD-10-CM

## 2021-09-05 DIAGNOSIS — F32A Depression, unspecified: Secondary | ICD-10-CM

## 2021-09-05 DIAGNOSIS — F419 Anxiety disorder, unspecified: Secondary | ICD-10-CM

## 2021-09-05 DIAGNOSIS — K580 Irritable bowel syndrome with diarrhea: Secondary | ICD-10-CM

## 2021-09-05 LAB — COMPREHENSIVE METABOLIC PANEL
ALT: 13 U/L (ref 0–35)
AST: 14 U/L (ref 0–37)
Albumin: 4.3 g/dL (ref 3.5–5.2)
Alkaline Phosphatase: 64 U/L (ref 39–117)
BUN: 15 mg/dL (ref 6–23)
CO2: 28 mEq/L (ref 19–32)
Calcium: 9.6 mg/dL (ref 8.4–10.5)
Chloride: 103 mEq/L (ref 96–112)
Creatinine, Ser: 0.78 mg/dL (ref 0.40–1.20)
GFR: 94.77 mL/min (ref >60.00)
Glucose, Bld: 101 mg/dL — ABNORMAL HIGH (ref 70–99)
Potassium: 4.1 mEq/L (ref 3.5–5.1)
Sodium: 137 mEq/L (ref 135–145)
Total Bilirubin: 0.3 mg/dL (ref 0.2–1.2)
Total Protein: 7.3 g/dL (ref 6.0–8.3)

## 2021-09-05 LAB — CBC WITH DIFFERENTIAL/PLATELET
Basophils Absolute: 0.1 10*3/uL (ref 0.0–0.1)
Basophils Relative: 0.6 % (ref 0.0–3.0)
Eosinophils Absolute: 0.1 10*3/uL (ref 0.0–0.7)
Eosinophils Relative: 1.1 % (ref 0.0–5.0)
HCT: 38.5 % (ref 36.0–46.0)
Hemoglobin: 12.5 g/dL (ref 12.0–15.0)
Lymphocytes Relative: 28.5 % (ref 12.0–46.0)
Lymphs Abs: 2.4 10*3/uL (ref 0.7–4.0)
MCHC: 32.5 g/dL (ref 30.0–36.0)
MCV: 82.4 fl (ref 78.0–100.0)
Monocytes Absolute: 0.3 10*3/uL (ref 0.1–1.0)
Monocytes Relative: 4.1 % (ref 3.0–12.0)
Neutro Abs: 5.5 10*3/uL (ref 1.4–7.7)
Neutrophils Relative %: 65.7 % (ref 43.0–77.0)
Platelets: 267 10*3/uL (ref 150.0–400.0)
RBC: 4.67 Mil/uL (ref 3.87–5.11)
RDW: 15 % (ref 11.5–15.5)
WBC: 8.4 10*3/uL (ref 4.0–10.5)

## 2021-09-05 LAB — TSH: TSH: 1.67 u[IU]/mL (ref 0.35–5.50)

## 2021-09-05 NOTE — Assessment & Plan Note (Signed)
New Has been feeling more fatigued recently for unknown reason She is only getting 6-7 hours of sleep at night and discussed if this is more consistently 6 hours that would definitely contribute to fatigue-strive for at least 7, ideally eat She is not currently exercising regularly, which may also be contributing-once able she will try to restart regular exercise Advised starting a multivitamin We will check CBC, CMP and TSH to rule out other causes

## 2021-09-05 NOTE — Assessment & Plan Note (Signed)
Chronic She states her anxiety is better, but at times does feel little depressed She thinks since the time of year-possibly holidays or wintertime She does not think she needs an increase in medication and feels she is doing okay overall Continue sertraline 150 mg daily and alprazolam 0.25 mg 3 times daily as needed Discussed ways of getting through the winter-planning things that she is something to look forward to Discussed that we can increase the sertraline if needed at any time

## 2021-09-05 NOTE — Patient Instructions (Signed)
    Blood work was ordered.      Medications changes include :   none     Please followup in 6 months  

## 2021-09-05 NOTE — Assessment & Plan Note (Signed)
Chronic Overall controlled Hyoscyamine 0.125 mg every 4 hours as needed, which she has not needed in a while

## 2021-09-05 NOTE — Assessment & Plan Note (Signed)
Chronic Well-controlled Continue trazodone 50 mg nightly

## 2021-09-24 ENCOUNTER — Other Ambulatory Visit (HOSPITAL_COMMUNITY): Payer: Self-pay

## 2021-10-18 ENCOUNTER — Other Ambulatory Visit: Payer: Self-pay | Admitting: Internal Medicine

## 2021-10-19 ENCOUNTER — Other Ambulatory Visit (HOSPITAL_COMMUNITY): Payer: Self-pay

## 2021-10-19 ENCOUNTER — Other Ambulatory Visit: Payer: Self-pay | Admitting: Internal Medicine

## 2021-10-21 MED ORDER — ALPRAZOLAM 0.25 MG PO TABS
0.2500 mg | ORAL_TABLET | Freq: Three times a day (TID) | ORAL | 1 refills | Status: DC | PRN
Start: 2021-10-21 — End: 2022-06-06
  Filled 2021-10-21: qty 30, 10d supply, fill #0
  Filled 2022-03-06: qty 30, 10d supply, fill #1

## 2021-10-22 ENCOUNTER — Other Ambulatory Visit (HOSPITAL_COMMUNITY): Payer: Self-pay

## 2021-11-01 ENCOUNTER — Other Ambulatory Visit (HOSPITAL_COMMUNITY): Payer: Self-pay

## 2021-11-01 MED ORDER — SPIRONOLACTONE 50 MG PO TABS
50.0000 mg | ORAL_TABLET | Freq: Two times a day (BID) | ORAL | 11 refills | Status: DC
  Filled 2021-11-01: qty 60, 30d supply, fill #0
  Filled 2021-12-03: qty 60, 30d supply, fill #1
  Filled 2022-01-07: qty 60, 30d supply, fill #2
  Filled 2022-02-08: qty 60, 30d supply, fill #3
  Filled 2022-03-06: qty 60, 30d supply, fill #4
  Filled 2022-05-03: qty 60, 30d supply, fill #5
  Filled 2022-06-06: qty 60, 30d supply, fill #6

## 2021-11-14 ENCOUNTER — Other Ambulatory Visit (HOSPITAL_COMMUNITY): Payer: Self-pay

## 2021-11-14 MED ORDER — KETOCONAZOLE 2 % EX CREA
1.0000 "application " | TOPICAL_CREAM | Freq: Two times a day (BID) | CUTANEOUS | 1 refills | Status: DC
  Filled 2021-11-14: qty 60, 30d supply, fill #0

## 2021-12-03 ENCOUNTER — Other Ambulatory Visit (HOSPITAL_COMMUNITY): Payer: Self-pay

## 2021-12-17 ENCOUNTER — Other Ambulatory Visit: Payer: Self-pay | Admitting: Internal Medicine

## 2021-12-18 ENCOUNTER — Other Ambulatory Visit (HOSPITAL_COMMUNITY): Payer: Self-pay

## 2021-12-18 MED ORDER — SERTRALINE HCL 100 MG PO TABS
150.0000 mg | ORAL_TABLET | Freq: Every day | ORAL | 3 refills | Status: DC
  Filled 2021-12-18: qty 90, 60d supply, fill #0
  Filled 2022-02-27: qty 90, 60d supply, fill #1
  Filled 2022-05-03: qty 90, 60d supply, fill #2
  Filled 2022-07-17: qty 90, 60d supply, fill #3

## 2022-01-07 ENCOUNTER — Other Ambulatory Visit (HOSPITAL_COMMUNITY): Payer: Self-pay

## 2022-02-08 ENCOUNTER — Other Ambulatory Visit (HOSPITAL_COMMUNITY): Payer: Self-pay

## 2022-02-28 ENCOUNTER — Other Ambulatory Visit (HOSPITAL_COMMUNITY): Payer: Self-pay

## 2022-03-01 ENCOUNTER — Other Ambulatory Visit (HOSPITAL_COMMUNITY): Payer: Self-pay

## 2022-03-06 ENCOUNTER — Other Ambulatory Visit (HOSPITAL_COMMUNITY): Payer: Self-pay

## 2022-03-11 ENCOUNTER — Encounter: Payer: Self-pay | Admitting: Internal Medicine

## 2022-03-12 ENCOUNTER — Telehealth: Payer: Self-pay | Admitting: *Deleted

## 2022-03-12 ENCOUNTER — Ambulatory Visit (INDEPENDENT_AMBULATORY_CARE_PROVIDER_SITE_OTHER): Payer: No Typology Code available for payment source | Admitting: Internal Medicine

## 2022-03-12 ENCOUNTER — Other Ambulatory Visit (HOSPITAL_COMMUNITY): Payer: Self-pay

## 2022-03-12 VITALS — BP 120/72 | HR 96 | Temp 98.2°F | Ht 63.0 in | Wt 150.0 lb

## 2022-03-12 DIAGNOSIS — G4709 Other insomnia: Secondary | ICD-10-CM

## 2022-03-12 DIAGNOSIS — K580 Irritable bowel syndrome with diarrhea: Secondary | ICD-10-CM | POA: Diagnosis not present

## 2022-03-12 DIAGNOSIS — K219 Gastro-esophageal reflux disease without esophagitis: Secondary | ICD-10-CM | POA: Diagnosis not present

## 2022-03-12 DIAGNOSIS — F419 Anxiety disorder, unspecified: Secondary | ICD-10-CM | POA: Insufficient documentation

## 2022-03-12 DIAGNOSIS — K589 Irritable bowel syndrome without diarrhea: Secondary | ICD-10-CM | POA: Insufficient documentation

## 2022-03-12 DIAGNOSIS — F32A Depression, unspecified: Secondary | ICD-10-CM

## 2022-03-12 MED ORDER — RIFAXIMIN 550 MG PO TABS
550.0000 mg | ORAL_TABLET | Freq: Three times a day (TID) | ORAL | 0 refills | Status: DC
Start: 2022-03-12 — End: 2022-09-19
  Filled 2022-03-12: qty 42, 14d supply, fill #0

## 2022-03-12 NOTE — Assessment & Plan Note (Signed)
Chronic GERD controlled Continue nexium 40 mg daily  

## 2022-03-14 NOTE — Telephone Encounter (Signed)
Rec'd determination fax med was DENIED. It states This request has received an Unfavorable outcome. Based on the information submitted for review, you did not meet our guideline rules for the requested drug. No alternative was given. Faxing denial to [facility]Marland KitchenJohny Chess

## 2022-03-18 ENCOUNTER — Encounter: Payer: Self-pay | Admitting: Internal Medicine

## 2022-05-03 ENCOUNTER — Other Ambulatory Visit (HOSPITAL_COMMUNITY): Payer: Self-pay

## 2022-06-06 ENCOUNTER — Other Ambulatory Visit (HOSPITAL_COMMUNITY): Payer: Self-pay

## 2022-06-06 ENCOUNTER — Other Ambulatory Visit (HOSPITAL_COMMUNITY): Payer: Self-pay | Admitting: Internal Medicine

## 2022-06-06 ENCOUNTER — Other Ambulatory Visit: Payer: Self-pay | Admitting: Internal Medicine

## 2022-06-06 MED ORDER — TRAZODONE HCL 50 MG PO TABS
25.0000 mg | ORAL_TABLET | Freq: Every evening | ORAL | 1 refills | Status: DC | PRN
  Filled 2022-06-06: qty 90, 90d supply, fill #0
  Filled 2022-10-01: qty 90, 90d supply, fill #1

## 2022-06-06 MED ORDER — ALPRAZOLAM 0.25 MG PO TABS
0.2500 mg | ORAL_TABLET | Freq: Three times a day (TID) | ORAL | 1 refills | Status: DC | PRN
  Filled 2022-06-06: qty 30, 10d supply, fill #0
  Filled 2022-11-04: qty 30, 10d supply, fill #1

## 2022-07-17 ENCOUNTER — Other Ambulatory Visit: Payer: Self-pay | Admitting: Internal Medicine

## 2022-07-18 ENCOUNTER — Other Ambulatory Visit (HOSPITAL_COMMUNITY): Payer: Self-pay

## 2022-07-18 MED ORDER — SPIRONOLACTONE 50 MG PO TABS
50.0000 mg | ORAL_TABLET | Freq: Two times a day (BID) | ORAL | 1 refills | Status: DC
  Filled 2022-07-18: qty 180, 90d supply, fill #0
  Filled 2022-11-04: qty 180, 90d supply, fill #1

## 2022-09-17 ENCOUNTER — Ambulatory Visit: Payer: No Typology Code available for payment source | Admitting: Internal Medicine

## 2022-09-19 NOTE — Patient Instructions (Addendum)
       Medications changes include :   None      Return in about 6 months (around 03/21/2023) for Physical Exam.

## 2022-09-19 NOTE — Progress Notes (Signed)
Subjective:    Patient ID: [name], female    DOB: 1981/06/14, 42 y.o.   MRN: [id number]     HPI [name] is here for follow up of her chronic medical problems, including anxiety, depression, insomnia, IBS, acne  She is taking her medication daily as prescribed.  Doing well.  Medications and allergies reviewed with patient and updated if appropriate.  Current Outpatient Medications on File Prior to Visit  Medication Sig Dispense Refill   ALPRAZolam (XANAX) 0.25 MG tablet Take 1 tablet (0.25 mg total) by mouth 3 (three) times daily as needed for anxiety. 30 tablet 1   cetirizine (ZYRTEC) 10 MG tablet Take 10 mg by mouth daily as needed for allergies.      Diclofenac Sodium (PENNSAID) 2 % SOLN Place 1 application onto the skin 2 (two) times daily. 112 g 2   esomeprazole (NEXIUM) 40 MG capsule      levonorgestrel (MIRENA, 52 MG,) 20 MCG/DAY IUD Take by intrauterine route.     Multiple Vitamin (MULTIVITAMIN) capsule Take 1 capsule by mouth daily.     Probiotic Product (PROBIOTIC PO) Take by mouth.     sertraline (ZOLOFT) 100 MG tablet Take 1.5 tablets (150 mg total) by mouth daily. 90 tablet 3   spironolactone (ALDACTONE) 50 MG tablet Take 1 tablet (50 mg total) by mouth 2 (two) times daily. 180 tablet 1   traZODone (DESYREL) 50 MG tablet Take 0.5-1 tablets (25-50 mg total) by mouth at bedtime as needed for sleep 90 tablet 1   triamcinolone (KENALOG) 0.147 MG/GM topical spray APPLY TO AFFECTED AREA(S) TWICE DAILY 63 g 2   triamcinolone cream (KENALOG) 0.1 % Apply 1 application topically 2 (two) times daily. 30 g 2   hyoscyamine (LEVSIN) 0.125 MG tablet TAKE 1 TABLET (0.125 MG TOTAL) BY MOUTH EVERY 4 (FOUR) HOURS AS NEEDED. 30 tablet 0   No current facility-administered medications on file prior to visit.     Review of Systems  Constitutional:  Negative for fever.  Respiratory:  Negative for cough, shortness of breath and wheezing.   Cardiovascular:   Negative for chest pain, palpitations and leg swelling.  Neurological:  Negative for light-headedness and headaches.       Objective:   Vitals:   09/20/22 1034  BP: 114/72  Pulse: 91  Temp: 98.1 F (36.7 C)  SpO2: 99%   BP Readings from Last 3 Encounters:  09/20/22 114/72  03/12/22 120/72  09/05/21 106/78   Wt Readings from Last 3 Encounters:  09/20/22 155 lb (70.3 kg)  03/12/22 150 lb (68 kg)  09/05/21 158 lb (71.7 kg)   Body mass index is 27.46 kg/m.    Physical Exam Constitutional:      General: She is not in acute distress.    Appearance: Normal appearance.  HENT:     Head: Normocephalic and atraumatic.  Eyes:     Conjunctiva/sclera: Conjunctivae normal.  Cardiovascular:     Rate and Rhythm: Normal rate and regular rhythm.     Heart sounds: Normal heart sounds. No murmur heard. Pulmonary:     Effort: Pulmonary effort is normal. No respiratory distress.     Breath sounds: Normal breath sounds. No wheezing.  Musculoskeletal:     Cervical back: Neck supple.     Right lower leg: No edema.     Left lower leg: No edema.  Lymphadenopathy:     Cervical: No cervical adenopathy.  Skin:    General: Skin  is warm and dry.     Findings: No rash.  Neurological:     Mental Status: She is alert. Mental status is at baseline.  Psychiatric:        Mood and Affect: Mood normal.        Behavior: Behavior normal.        Lab Results  Component Value Date   WBC 8.4 09/05/2021   HGB 12.5 09/05/2021   HCT 38.5 09/05/2021   PLT 267.0 09/05/2021   GLUCOSE 101 (H) 09/05/2021   CHOL 120 03/13/2021   TRIG 122.0 03/13/2021   HDL 50.40 03/13/2021   LDLCALC 45 03/13/2021   ALT 13 09/05/2021   AST 14 09/05/2021   NA 137 09/05/2021   K 4.1 09/05/2021   CL 103 09/05/2021   CREATININE 0.78 09/05/2021   BUN 15 09/05/2021   CO2 28 09/05/2021   TSH 1.67 09/05/2021   HGBA1C 5.8 03/13/2021     Assessment & Plan:    See Problem List for Assessment and Plan of chronic  medical problems.

## 2022-09-20 ENCOUNTER — Ambulatory Visit (INDEPENDENT_AMBULATORY_CARE_PROVIDER_SITE_OTHER): Payer: 59 | Admitting: Internal Medicine

## 2022-09-20 ENCOUNTER — Encounter: Payer: Self-pay | Admitting: Internal Medicine

## 2022-09-20 VITALS — BP 114/72 | HR 91 | Temp 98.1°F | Ht 63.0 in | Wt 155.0 lb

## 2022-09-20 DIAGNOSIS — F419 Anxiety disorder, unspecified: Secondary | ICD-10-CM

## 2022-09-20 DIAGNOSIS — F410 Panic disorder [episodic paroxysmal anxiety] without agoraphobia: Secondary | ICD-10-CM

## 2022-09-20 DIAGNOSIS — F32A Depression, unspecified: Secondary | ICD-10-CM

## 2022-09-20 DIAGNOSIS — K580 Irritable bowel syndrome with diarrhea: Secondary | ICD-10-CM

## 2022-09-20 DIAGNOSIS — G4709 Other insomnia: Secondary | ICD-10-CM

## 2022-09-20 DIAGNOSIS — K219 Gastro-esophageal reflux disease without esophagitis: Secondary | ICD-10-CM

## 2022-09-20 DIAGNOSIS — L700 Acne vulgaris: Secondary | ICD-10-CM

## 2022-09-20 NOTE — Assessment & Plan Note (Signed)
Chronic Controlled, Stable Continue trazodone 25-50 mg bedtime as needed

## 2022-09-20 NOTE — Assessment & Plan Note (Signed)
Chronic Controlled, Stable Continue sertraline 150 mg daily, alprazolam 0.25 mg 3 times daily as needed

## 2022-09-20 NOTE — Assessment & Plan Note (Addendum)
Chronic GERD controlled Continue nexium 40 mg daily - try going to QOD

## 2022-09-20 NOTE — Assessment & Plan Note (Signed)
Chronic Controlled Continue spironolactone 50 mg twice daily

## 2022-09-20 NOTE — Assessment & Plan Note (Signed)
Chronic As needed Xifaxan intermittently which has been effective Continue Levsin 0.125 mg every 4 hours as needed Continue probiotics

## 2022-10-01 ENCOUNTER — Other Ambulatory Visit: Payer: Self-pay | Admitting: Internal Medicine

## 2022-10-02 ENCOUNTER — Other Ambulatory Visit: Payer: Self-pay

## 2022-10-02 ENCOUNTER — Other Ambulatory Visit (HOSPITAL_COMMUNITY): Payer: Self-pay

## 2022-10-02 MED ORDER — TRIAMCINOLONE ACETONIDE 0.147 MG/GM EX AERS
INHALATION_SPRAY | CUTANEOUS | 2 refills | Status: AC
  Filled 2022-10-02: qty 63, 30d supply, fill #0
  Filled 2023-03-03 – 2023-06-05 (×2): qty 63, 30d supply, fill #1

## 2022-10-02 MED ORDER — SERTRALINE HCL 100 MG PO TABS
150.0000 mg | ORAL_TABLET | Freq: Every day | ORAL | 3 refills | Status: DC
  Filled 2022-10-02: qty 90, 60d supply, fill #0
  Filled 2022-12-02: qty 90, 60d supply, fill #1
  Filled 2023-02-03: qty 90, 60d supply, fill #2

## 2022-10-03 ENCOUNTER — Other Ambulatory Visit (HOSPITAL_COMMUNITY): Payer: Self-pay

## 2022-11-04 ENCOUNTER — Other Ambulatory Visit: Payer: Self-pay

## 2022-11-08 DIAGNOSIS — R519 Headache, unspecified: Secondary | ICD-10-CM | POA: Diagnosis not present

## 2022-11-08 DIAGNOSIS — H53132 Sudden visual loss, left eye: Secondary | ICD-10-CM | POA: Diagnosis not present

## 2022-11-08 DIAGNOSIS — H33022 Retinal detachment with multiple breaks, left eye: Secondary | ICD-10-CM | POA: Diagnosis not present

## 2022-11-08 DIAGNOSIS — H33323 Round hole, bilateral: Secondary | ICD-10-CM | POA: Diagnosis not present

## 2022-11-08 DIAGNOSIS — H43392 Other vitreous opacities, left eye: Secondary | ICD-10-CM | POA: Diagnosis not present

## 2022-11-08 DIAGNOSIS — H53452 Other localized visual field defect, left eye: Secondary | ICD-10-CM | POA: Diagnosis not present

## 2022-11-12 DIAGNOSIS — H33022 Retinal detachment with multiple breaks, left eye: Secondary | ICD-10-CM | POA: Diagnosis not present

## 2022-11-13 ENCOUNTER — Other Ambulatory Visit (HOSPITAL_COMMUNITY): Payer: Self-pay

## 2022-11-13 MED ORDER — OFLOXACIN 0.3 % OP SOLN
1.0000 [drp] | Freq: Four times a day (QID) | OPHTHALMIC | 0 refills | Status: DC
  Filled 2022-11-13: qty 5, 7d supply, fill #0

## 2022-11-13 MED ORDER — PREDNISOLONE ACETATE 1 % OP SUSP
1.0000 [drp] | Freq: Four times a day (QID) | OPHTHALMIC | 0 refills | Status: DC
  Filled 2022-11-13: qty 15, 75d supply, fill #0

## 2022-11-18 ENCOUNTER — Other Ambulatory Visit: Payer: Self-pay | Admitting: *Deleted

## 2022-11-18 MED ORDER — FLUCONAZOLE 150 MG PO TABS: 150.0000 mg | ORAL_TABLET | Freq: Every day | ORAL | 0 refills | Status: DC

## 2022-12-02 ENCOUNTER — Other Ambulatory Visit (HOSPITAL_COMMUNITY): Payer: Self-pay

## 2022-12-20 ENCOUNTER — Other Ambulatory Visit (HOSPITAL_COMMUNITY): Payer: Self-pay

## 2022-12-20 DIAGNOSIS — H25812 Combined forms of age-related cataract, left eye: Secondary | ICD-10-CM | POA: Diagnosis not present

## 2022-12-20 DIAGNOSIS — H5319 Other subjective visual disturbances: Secondary | ICD-10-CM | POA: Diagnosis not present

## 2022-12-20 DIAGNOSIS — H33022 Retinal detachment with multiple breaks, left eye: Secondary | ICD-10-CM | POA: Diagnosis not present

## 2022-12-20 DIAGNOSIS — H53142 Visual discomfort, left eye: Secondary | ICD-10-CM | POA: Diagnosis not present

## 2022-12-20 MED ORDER — PILOCARPINE HCL 1 % OP SOLN
1.0000 [drp] | Freq: Every day | OPHTHALMIC | 0 refills | Status: DC
  Filled 2022-12-20: qty 15, 90d supply, fill #0

## 2022-12-23 ENCOUNTER — Other Ambulatory Visit (HOSPITAL_COMMUNITY): Payer: Self-pay

## 2022-12-30 ENCOUNTER — Encounter: Payer: Self-pay | Admitting: *Deleted

## 2023-01-03 DIAGNOSIS — Z01419 Encounter for gynecological examination (general) (routine) without abnormal findings: Secondary | ICD-10-CM | POA: Diagnosis not present

## 2023-01-03 DIAGNOSIS — Z01411 Encounter for gynecological examination (general) (routine) with abnormal findings: Secondary | ICD-10-CM | POA: Diagnosis not present

## 2023-01-03 DIAGNOSIS — Z1231 Encounter for screening mammogram for malignant neoplasm of breast: Secondary | ICD-10-CM | POA: Diagnosis not present

## 2023-01-03 DIAGNOSIS — Z124 Encounter for screening for malignant neoplasm of cervix: Secondary | ICD-10-CM | POA: Diagnosis not present

## 2023-01-03 DIAGNOSIS — Z113 Encounter for screening for infections with a predominantly sexual mode of transmission: Secondary | ICD-10-CM | POA: Diagnosis not present

## 2023-01-03 LAB — HM MAMMOGRAPHY

## 2023-02-03 ENCOUNTER — Other Ambulatory Visit (HOSPITAL_COMMUNITY): Payer: Self-pay

## 2023-02-03 ENCOUNTER — Other Ambulatory Visit: Payer: Self-pay

## 2023-02-03 ENCOUNTER — Other Ambulatory Visit: Payer: Self-pay | Admitting: Internal Medicine

## 2023-02-03 MED ORDER — SPIRONOLACTONE 50 MG PO TABS
50.0000 mg | ORAL_TABLET | Freq: Two times a day (BID) | ORAL | 0 refills | Status: DC
  Filled 2023-02-03: qty 180, 90d supply, fill #0

## 2023-02-04 DIAGNOSIS — H25042 Posterior subcapsular polar age-related cataract, left eye: Secondary | ICD-10-CM | POA: Diagnosis not present

## 2023-02-04 DIAGNOSIS — Z9889 Other specified postprocedural states: Secondary | ICD-10-CM | POA: Diagnosis not present

## 2023-02-17 ENCOUNTER — Telehealth: Payer: Self-pay | Admitting: Internal Medicine

## 2023-02-17 DIAGNOSIS — Z0000 Encounter for general adult medical examination without abnormal findings: Secondary | ICD-10-CM

## 2023-02-17 DIAGNOSIS — R7303 Prediabetes: Secondary | ICD-10-CM

## 2023-02-17 NOTE — Telephone Encounter (Signed)
Pt has CPE scheduled for 7/12 and lab appt 6/28.  Please enter lab orders.

## 2023-02-18 DIAGNOSIS — R7303 Prediabetes: Secondary | ICD-10-CM | POA: Insufficient documentation

## 2023-02-18 NOTE — Telephone Encounter (Signed)
ordered

## 2023-02-21 ENCOUNTER — Encounter: Payer: Self-pay | Admitting: Internal Medicine

## 2023-02-21 NOTE — Progress Notes (Signed)
Outside notes received. Information abstracted. Notes sent to scan.  

## 2023-02-27 DIAGNOSIS — H25042 Posterior subcapsular polar age-related cataract, left eye: Secondary | ICD-10-CM | POA: Diagnosis not present

## 2023-02-27 DIAGNOSIS — H268 Other specified cataract: Secondary | ICD-10-CM | POA: Diagnosis not present

## 2023-03-03 ENCOUNTER — Other Ambulatory Visit: Payer: Self-pay | Admitting: Internal Medicine

## 2023-03-03 MED ORDER — TRIAMCINOLONE ACETONIDE 0.1 % EX CREA
1.0000 | TOPICAL_CREAM | Freq: Two times a day (BID) | CUTANEOUS | 2 refills | Status: AC
  Filled 2023-03-03: qty 30, 15d supply, fill #0
  Filled 2023-06-05: qty 30, 15d supply, fill #1
  Filled 2024-02-10: qty 30, 15d supply, fill #2

## 2023-03-04 ENCOUNTER — Other Ambulatory Visit: Payer: Self-pay

## 2023-03-04 ENCOUNTER — Other Ambulatory Visit (HOSPITAL_COMMUNITY): Payer: Self-pay

## 2023-03-07 ENCOUNTER — Other Ambulatory Visit (HOSPITAL_COMMUNITY): Payer: Self-pay

## 2023-03-10 ENCOUNTER — Other Ambulatory Visit (HOSPITAL_COMMUNITY): Payer: Self-pay

## 2023-03-13 DIAGNOSIS — H35372 Puckering of macula, left eye: Secondary | ICD-10-CM | POA: Diagnosis not present

## 2023-03-13 DIAGNOSIS — H33311 Horseshoe tear of retina without detachment, right eye: Secondary | ICD-10-CM | POA: Diagnosis not present

## 2023-03-13 DIAGNOSIS — H43391 Other vitreous opacities, right eye: Secondary | ICD-10-CM | POA: Diagnosis not present

## 2023-03-13 DIAGNOSIS — H338 Other retinal detachments: Secondary | ICD-10-CM | POA: Diagnosis not present

## 2023-03-14 ENCOUNTER — Other Ambulatory Visit (INDEPENDENT_AMBULATORY_CARE_PROVIDER_SITE_OTHER): Payer: 59

## 2023-03-14 DIAGNOSIS — Z0000 Encounter for general adult medical examination without abnormal findings: Secondary | ICD-10-CM | POA: Diagnosis not present

## 2023-03-14 DIAGNOSIS — R7303 Prediabetes: Secondary | ICD-10-CM

## 2023-03-14 LAB — CBC WITH DIFFERENTIAL/PLATELET
Basophils Absolute: 0.1 10*3/uL (ref 0.0–0.1)
Basophils Relative: 0.7 % (ref 0.0–3.0)
Eosinophils Absolute: 0.1 10*3/uL (ref 0.0–0.7)
Eosinophils Relative: 1.3 % (ref 0.0–5.0)
HCT: 43.8 % (ref 36.0–46.0)
Hemoglobin: 14.8 g/dL (ref 12.0–15.0)
Lymphocytes Relative: 26.2 % (ref 12.0–46.0)
Lymphs Abs: 2.2 10*3/uL (ref 0.7–4.0)
MCHC: 33.9 g/dL (ref 30.0–36.0)
MCV: 92.6 fl (ref 78.0–100.0)
Monocytes Absolute: 0.3 10*3/uL (ref 0.1–1.0)
Monocytes Relative: 3.7 % (ref 3.0–12.0)
Neutro Abs: 5.7 10*3/uL (ref 1.4–7.7)
Neutrophils Relative %: 68.1 % (ref 43.0–77.0)
Platelets: 262 10*3/uL (ref 150.0–400.0)
RBC: 4.73 Mil/uL (ref 3.87–5.11)
RDW: 12.6 % (ref 11.5–15.5)
WBC: 8.4 10*3/uL (ref 4.0–10.5)

## 2023-03-14 LAB — LIPID PANEL
Cholesterol: 115 mg/dL (ref 0–200)
HDL: 45.3 mg/dL (ref >39.00)
LDL Cholesterol: 55 mg/dL (ref 0–99)
NonHDL: 69.48
Total CHOL/HDL Ratio: 3
Triglycerides: 71 mg/dL (ref 0.0–149.0)
VLDL: 14.2 mg/dL (ref 0.0–40.0)

## 2023-03-14 LAB — COMPREHENSIVE METABOLIC PANEL
ALT: 14 U/L (ref 0–35)
AST: 15 U/L (ref 0–37)
Albumin: 4.6 g/dL (ref 3.5–5.2)
Alkaline Phosphatase: 63 U/L (ref 39–117)
BUN: 15 mg/dL (ref 6–23)
CO2: 27 mEq/L (ref 19–32)
Calcium: 9.6 mg/dL (ref 8.4–10.5)
Chloride: 103 mEq/L (ref 96–112)
Creatinine, Ser: 0.77 mg/dL (ref 0.40–1.20)
GFR: 95.23 mL/min (ref >60.00)
Glucose, Bld: 104 mg/dL — ABNORMAL HIGH (ref 70–99)
Potassium: 4.3 mEq/L (ref 3.5–5.1)
Sodium: 138 mEq/L (ref 135–145)
Total Bilirubin: 0.4 mg/dL (ref 0.2–1.2)
Total Protein: 7.5 g/dL (ref 6.0–8.3)

## 2023-03-14 LAB — HEMOGLOBIN A1C: Hgb A1c MFr Bld: 5.5 % (ref 4.6–6.5)

## 2023-03-14 LAB — TSH: TSH: 2.38 u[IU]/mL (ref 0.35–5.50)

## 2023-03-21 ENCOUNTER — Encounter: Payer: 59 | Admitting: Internal Medicine

## 2023-03-24 ENCOUNTER — Other Ambulatory Visit (HOSPITAL_COMMUNITY): Payer: Self-pay

## 2023-03-24 DIAGNOSIS — H33311 Horseshoe tear of retina without detachment, right eye: Secondary | ICD-10-CM | POA: Diagnosis not present

## 2023-03-28 ENCOUNTER — Encounter: Payer: 59 | Admitting: Internal Medicine

## 2023-04-20 NOTE — Progress Notes (Unsigned)
Subjective:    Patient ID: [name], female    DOB: 01/23/1981, 42 y.o.   MRN: [id number]      HPI Nixie is here for a Physical exam and her chronic medical problems.    Had labs done  Medications and allergies reviewed with patient and updated if appropriate.  Current Outpatient Medications on File Prior to Visit  Medication Sig Dispense Refill   fluconazole (DIFLUCAN) 150 MG tablet Take 1 tablet (150 mg total) by mouth daily. 5 tablet 0   ALPRAZolam (XANAX) 0.25 MG tablet Take 1 tablet (0.25 mg total) by mouth 3 (three) times daily as needed for anxiety. 30 tablet 1   cetirizine (ZYRTEC) 10 MG tablet Take 10 mg by mouth daily as needed for allergies.      Diclofenac Sodium (PENNSAID) 2 % SOLN Place 1 application onto the skin 2 (two) times daily. 112 g 2   esomeprazole (NEXIUM) 40 MG capsule      hyoscyamine (LEVSIN) 0.125 MG tablet TAKE 1 TABLET (0.125 MG TOTAL) BY MOUTH EVERY 4 (FOUR) HOURS AS NEEDED. 30 tablet 0   levonorgestrel (MIRENA, 52 MG,) 20 MCG/DAY IUD Take by intrauterine route.     Multiple Vitamin (MULTIVITAMIN) capsule Take 1 capsule by mouth daily.     ofloxacin (OCUFLOX) 0.3 % ophthalmic solution Place 1 drop into the left eye 4 (four) times daily for one week. 5 mL 0   pilocarpine (PILOCAR) 1 % ophthalmic solution Place 1 drop into the left eye daily. 15 mL 0   prednisoLONE acetate (PRED FORTE) 1 % ophthalmic suspension Place 1 drop into the left eye 4  times daily for 1 week, then 3  times daily for 1 week, then 2 times daily for 1 week, then once a day for 1 week. 15 mL 0   Probiotic Product (PROBIOTIC PO) Take by mouth.     sertraline (ZOLOFT) 100 MG tablet Take 1.5 tablets (150 mg total) by mouth daily. 90 tablet 3   spironolactone (ALDACTONE) 50 MG tablet Take 1 tablet (50 mg total) by mouth 2 (two) times daily. Follow-up appt due in July 180 tablet 0   traZODone (DESYREL) 50 MG tablet Take 0.5-1 tablets (25-50 mg total) by mouth  at bedtime as needed for sleep 90 tablet 1   triamcinolone (KENALOG) 0.147 MG/GM topical spray APPLY TO AFFECTED AREA(S) TWICE DAILY 63 g 2   triamcinolone cream (KENALOG) 0.1 % Apply 1 Application topically 2 (two) times daily. 30 g 2   No current facility-administered medications on file prior to visit.    Review of Systems     Objective:  There were no vitals filed for this visit. There were no vitals filed for this visit. There is no height or weight on file to calculate BMI.  BP Readings from Last 3 Encounters:  09/20/22 114/72  03/12/22 120/72  09/05/21 106/78    Wt Readings from Last 3 Encounters:  09/20/22 155 lb (70.3 kg)  03/12/22 150 lb (68 kg)  09/05/21 158 lb (71.7 kg)       Physical Exam Constitutional: She appears well-developed and well-nourished. No distress.  HENT:  Head: Normocephalic and atraumatic.  Right Ear: External ear normal. Normal ear canal and TM Left Ear: External ear normal.  Normal ear canal and TM Mouth/Throat: Oropharynx is clear and moist.  Eyes: Conjunctivae normal.  Neck: Neck supple. No tracheal deviation present. No thyromegaly present.  No carotid bruit  Cardiovascular: Normal rate, regular  rhythm and normal heart sounds.   No murmur heard.  No edema. Pulmonary/Chest: Effort normal and breath sounds normal. No respiratory distress. She has no wheezes. She has no rales.  Breast: deferred   Abdominal: Soft. She exhibits no distension. There is no tenderness.  Lymphadenopathy: She has no cervical adenopathy.  Skin: Skin is warm and dry. She is not diaphoretic.  Psychiatric: She has a normal mood and affect. Her behavior is normal.     Lab Results  Component Value Date   WBC 8.4 03/14/2023   HGB 14.8 03/14/2023   HCT 43.8 03/14/2023   PLT 262.0 03/14/2023   GLUCOSE 104 (H) 03/14/2023   CHOL 115 03/14/2023   TRIG 71.0 03/14/2023   HDL 45.30 03/14/2023   LDLCALC 55 03/14/2023   ALT 14 03/14/2023   AST 15 03/14/2023   NA  138 03/14/2023   K 4.3 03/14/2023   CL 103 03/14/2023   CREATININE 0.77 03/14/2023   BUN 15 03/14/2023   CO2 27 03/14/2023   TSH 2.38 03/14/2023   HGBA1C 5.5 03/14/2023         Assessment & Plan:   Physical exam: Screening blood work  ordered/reviewed Exercise   Weight   Substance abuse  none   Reviewed recommended immunizations.   Health Maintenance  Topic Date Due   Hepatitis C Screening  Never done   COVID-19 Vaccine (2 - 2023-24 season) 05/17/2022   INFLUENZA VACCINE  04/17/2023   MAMMOGRAM  01/03/2024   PAP SMEAR-Modifier  10/24/2024   Colonoscopy  10/20/2025   DTaP/Tdap/Td (4 - Td or Tdap) 08/02/2029   HIV Screening  Completed   HPV VACCINES  Aged Out          See Problem List for Assessment and Plan of chronic medical problems.

## 2023-04-20 NOTE — Patient Instructions (Addendum)
Blood work was ordered.   The lab is on the first floor.    Medications changes include :       A referral was ordered and someone will call you to schedule an appointment.     Return in about 6 months (around 10/22/2023) for follow up.   Health Maintenance, Female Adopting a healthy lifestyle and getting preventive care are important in promoting health and wellness. Ask your health care provider about: The right schedule for you to have regular tests and exams. Things you can do on your own to prevent diseases and keep yourself healthy. What should I know about diet, weight, and exercise? Eat a healthy diet  Eat a diet that includes plenty of vegetables, fruits, low-fat dairy products, and lean protein. Do not eat a lot of foods that are high in solid fats, added sugars, or sodium. Maintain a healthy weight Body mass index (BMI) is used to identify weight problems. It estimates body fat based on height and weight. Your health care provider can help determine your BMI and help you achieve or maintain a healthy weight. Get regular exercise Get regular exercise. This is one of the most important things you can do for your health. Most adults should: Exercise for at least 150 minutes each week. The exercise should increase your heart rate and make you sweat (moderate-intensity exercise). Do strengthening exercises at least twice a week. This is in addition to the moderate-intensity exercise. Spend less time sitting. Even light physical activity can be beneficial. Watch cholesterol and blood lipids Have your blood tested for lipids and cholesterol at 42 years of age, then have this test every 5 years. Have your cholesterol levels checked more often if: Your lipid or cholesterol levels are high. You are older than 42 years of age. You are at high risk for heart disease. What should I know about cancer screening? Depending on your health history and family history, you may  need to have cancer screening at various ages. This may include screening for: Breast cancer. Cervical cancer. Colorectal cancer. Skin cancer. Lung cancer. What should I know about heart disease, diabetes, and high blood pressure? Blood pressure and heart disease High blood pressure causes heart disease and increases the risk of stroke. This is more likely to develop in people who have high blood pressure readings or are overweight. Have your blood pressure checked: Every 3-5 years if you are 79-83 years of age. Every year if you are 62 years old or older. Diabetes Have regular diabetes screenings. This checks your fasting blood sugar level. Have the screening done: Once every three years after age 76 if you are at a normal weight and have a low risk for diabetes. More often and at a younger age if you are overweight or have a high risk for diabetes. What should I know about preventing infection? Hepatitis B If you have a higher risk for hepatitis B, you should be screened for this virus. Talk with your health care provider to find out if you are at risk for hepatitis B infection. Hepatitis C Testing is recommended for: Everyone born from [birth year] through 1965. Anyone with known risk factors for hepatitis C. Sexually transmitted infections (STIs) Get screened for STIs, including gonorrhea and chlamydia, if: You are sexually active and are younger than 42 years of age. You are older than 42 years of age and your health care provider tells you that you are at risk for this type  of infection. Your sexual activity has changed since you were last screened, and you are at increased risk for chlamydia or gonorrhea. Ask your health care provider if you are at risk. Ask your health care provider about whether you are at high risk for HIV. Your health care provider may recommend a prescription medicine to help prevent HIV infection. If you choose to take medicine to prevent HIV, you should first get  tested for HIV. You should then be tested every 3 months for as long as you are taking the medicine. Pregnancy If you are about to stop having your period (premenopausal) and you may become pregnant, seek counseling before you get pregnant. Take 400 to 800 micrograms (mcg) of folic acid every day if you become pregnant. Ask for birth control (contraception) if you want to prevent pregnancy. Osteoporosis and menopause Osteoporosis is a disease in which the bones lose minerals and strength with aging. This can result in bone fractures. If you are 88 years old or older, or if you are at risk for osteoporosis and fractures, ask your health care provider if you should: Be screened for bone loss. Take a calcium or vitamin D supplement to lower your risk of fractures. Be given hormone replacement therapy (HRT) to treat symptoms of menopause. Follow these instructions at home: Alcohol use Do not drink alcohol if: Your health care provider tells you not to drink. You are pregnant, may be pregnant, or are planning to become pregnant. If you drink alcohol: Limit how much you have to: 0-1 drink a day. Know how much alcohol is in your drink. In the [place] one drink equals one 12 oz bottle of beer (355 mL), one 5 oz glass of wine (148 mL), or one 1 oz glass of hard liquor (44 mL). Lifestyle Do not use any products that contain nicotine or tobacco. These products include cigarettes, chewing tobacco, and vaping devices, such as e-cigarettes. If you need help quitting, ask your health care provider. Do not use street drugs. Do not share needles. Ask your health care provider for help if you need support or information about quitting drugs. General instructions Schedule regular health, dental, and eye exams. Stay current with your vaccines. Tell your health care provider if: You often feel depressed. You have ever been abused or do not feel safe at home. Summary Adopting a healthy lifestyle and getting  preventive care are important in promoting health and wellness. Follow your health care provider's instructions about healthy diet, exercising, and getting tested or screened for diseases. Follow your health care provider's instructions on monitoring your cholesterol and blood pressure. This information is not intended to replace advice given to you by your health care provider. Make sure you discuss any questions you have with your health care provider. Document Revised: 01/22/2021 Document Reviewed: 01/22/2021 Elsevier Patient Education  2024 [employer].

## 2023-04-21 ENCOUNTER — Other Ambulatory Visit (HOSPITAL_COMMUNITY): Payer: Self-pay

## 2023-04-21 ENCOUNTER — Encounter: Payer: Self-pay | Admitting: Internal Medicine

## 2023-04-21 ENCOUNTER — Ambulatory Visit (INDEPENDENT_AMBULATORY_CARE_PROVIDER_SITE_OTHER): Payer: 59 | Admitting: Internal Medicine

## 2023-04-21 VITALS — BP 108/74 | HR 68 | Temp 98.3°F | Ht 63.0 in | Wt 160.2 lb

## 2023-04-21 DIAGNOSIS — K219 Gastro-esophageal reflux disease without esophagitis: Secondary | ICD-10-CM

## 2023-04-21 DIAGNOSIS — F32A Depression, unspecified: Secondary | ICD-10-CM

## 2023-04-21 DIAGNOSIS — L700 Acne vulgaris: Secondary | ICD-10-CM | POA: Diagnosis not present

## 2023-04-21 DIAGNOSIS — F419 Anxiety disorder, unspecified: Secondary | ICD-10-CM | POA: Diagnosis not present

## 2023-04-21 DIAGNOSIS — K580 Irritable bowel syndrome with diarrhea: Secondary | ICD-10-CM

## 2023-04-21 DIAGNOSIS — F410 Panic disorder [episodic paroxysmal anxiety] without agoraphobia: Secondary | ICD-10-CM | POA: Diagnosis not present

## 2023-04-21 DIAGNOSIS — G4709 Other insomnia: Secondary | ICD-10-CM | POA: Diagnosis not present

## 2023-04-21 DIAGNOSIS — Z0000 Encounter for general adult medical examination without abnormal findings: Secondary | ICD-10-CM

## 2023-04-21 DIAGNOSIS — R7303 Prediabetes: Secondary | ICD-10-CM | POA: Diagnosis not present

## 2023-04-21 MED ORDER — FLUOXETINE HCL 20 MG PO CAPS
20.0000 mg | ORAL_CAPSULE | Freq: Every day | ORAL | 3 refills | Status: DC
Start: 2023-04-21 — End: 2023-07-24
  Filled 2023-04-21: qty 30, 30d supply, fill #0
  Filled 2023-06-05: qty 30, 30d supply, fill #1
  Filled 2023-07-03: qty 30, 30d supply, fill #2

## 2023-04-21 MED ORDER — SERTRALINE HCL 50 MG PO TABS
50.0000 mg | ORAL_TABLET | Freq: Every day | ORAL | 0 refills | Status: DC
  Filled 2023-04-21: qty 14, 14d supply, fill #0

## 2023-04-21 MED ORDER — TRAZODONE HCL 50 MG PO TABS
25.0000 mg | ORAL_TABLET | Freq: Every evening | ORAL | 1 refills | Status: AC | PRN
  Filled 2023-04-21: qty 90, 90d supply, fill #0
  Filled 2023-10-24: qty 90, 90d supply, fill #1

## 2023-04-21 MED ORDER — SPIRONOLACTONE 50 MG PO TABS
50.0000 mg | ORAL_TABLET | Freq: Two times a day (BID) | ORAL | 1 refills | Status: DC
  Filled 2023-04-21: qty 180, 90d supply, fill #0
  Filled 2023-10-24: qty 180, 90d supply, fill #1

## 2023-04-21 NOTE — Assessment & Plan Note (Signed)
Chronic Controlled Continue spironolactone 50 mg twice daily

## 2023-04-21 NOTE — Assessment & Plan Note (Addendum)
Chronic TSH within normal range Anxiety controlled and stable Depression is not ideally controlled Taper off sertraline as advised and start fluoxetine 20 mg daily Continue alprazolam 0.25 mg 3 times daily as needed Will follow-up in approximately 6 weeks to determine if this has helped and to adjust dose

## 2023-04-21 NOTE — Assessment & Plan Note (Addendum)
Chronic Lab Results  Component Value Date   HGBA1C 5.5 03/14/2023   Sugars improved and now in the normal range CBC, CMP, lipid panel normal Low sugar / carb diet Stressed regular exercise

## 2023-04-21 NOTE — Assessment & Plan Note (Signed)
Chronic GERD controlled Not currently on any medication

## 2023-04-21 NOTE — Assessment & Plan Note (Addendum)
Chronic Controlled, Stable Continue alprazolam 0.25 mg 3 times daily as needed Depression is not controlled so we will be tapering off of the sertraline and starting fluoxetine

## 2023-04-21 NOTE — Assessment & Plan Note (Addendum)
Chronic Has intermittent diarrhea Has needed Xifaxan intermittently which has been effective Continue Levsin 0.125 mg every 4 hours as needed Continue probiotics

## 2023-04-21 NOTE — Assessment & Plan Note (Addendum)
Chronic Controlled, Stable Continue trazodone 25 mg bedtime as needed - taking most nights

## 2023-05-05 DIAGNOSIS — H35411 Lattice degeneration of retina, right eye: Secondary | ICD-10-CM | POA: Diagnosis not present

## 2023-05-05 DIAGNOSIS — H59812 Chorioretinal scars after surgery for detachment, left eye: Secondary | ICD-10-CM | POA: Diagnosis not present

## 2023-05-05 DIAGNOSIS — H26492 Other secondary cataract, left eye: Secondary | ICD-10-CM | POA: Diagnosis not present

## 2023-05-05 DIAGNOSIS — H338 Other retinal detachments: Secondary | ICD-10-CM | POA: Diagnosis not present

## 2023-05-05 DIAGNOSIS — H35372 Puckering of macula, left eye: Secondary | ICD-10-CM | POA: Diagnosis not present

## 2023-05-08 NOTE — Progress Notes (Signed)
Subjective:    Patient ID: [name], female    DOB: Dec 18, 1980, 42 y.o.   MRN: [id number]      HPI Jamiyla is here for  Chief Complaint  Patient presents with   Abdominal Pain    Abdominal pain, bloating, nausea, some blood in stool noted as well. Monday is when symptoms started.      Abdominal pain, bloating - ? Hernia ---   Has hernia - does not think the hernia is related to her other symptoms.  Since Monday she has had bloody mucus diarrhea, burping, diarrhea, diffuse abdominal discomfort.    Has had intermittent abdominal cramping and diarrhea but usually able to get it under control.  This is the first time she has had blood in the stool. The blood is gone now, but still having diarrhea and has abdominal pain.    Medications and allergies reviewed with patient and updated if appropriate.  Current Outpatient Medications on File Prior to Visit  Medication Sig Dispense Refill   ALPRAZolam (XANAX) 0.25 MG tablet Take 1 tablet (0.25 mg total) by mouth 3 (three) times daily as needed for anxiety. 30 tablet 1   cetirizine (ZYRTEC) 10 MG tablet Take 10 mg by mouth daily as needed for allergies.      Diclofenac Sodium (PENNSAID) 2 % SOLN Place 1 application onto the skin 2 (two) times daily. 112 g 2   famotidine (PEPCID) 20 MG tablet      FLUoxetine (PROZAC) 20 MG capsule Take 1 capsule (20 mg total) by mouth daily. 30 capsule 3   ibuprofen (IBU-200) 200 MG tablet      levonorgestrel (MIRENA, 52 MG,) 20 MCG/DAY IUD Take by intrauterine route.     Multiple Vitamin (MULTIVITAMIN) capsule Take 1 capsule by mouth daily.     Probiotic Product (PROBIOTIC PO) Take by mouth.     sertraline (ZOLOFT) 50 MG tablet Take 1 tablet (50 mg total) by mouth daily. 14 tablet 0   spironolactone (ALDACTONE) 50 MG tablet Take 1 tablet (50 mg total) by mouth 2 (two) times daily. 180 tablet 1   traZODone (DESYREL) 50 MG tablet Take 0.5-1 tablets (25-50 mg total) by mouth at  bedtime as needed for sleep 90 tablet 1   triamcinolone (KENALOG) 0.147 MG/GM topical spray APPLY TO AFFECTED AREA(S) TWICE DAILY 63 g 2   triamcinolone cream (KENALOG) 0.1 % Apply 1 Application topically 2 (two) times daily. 30 g 2   hyoscyamine (LEVSIN) 0.125 MG tablet TAKE 1 TABLET (0.125 MG TOTAL) BY MOUTH EVERY 4 (FOUR) HOURS AS NEEDED. 30 tablet 0   No current facility-administered medications on file prior to visit.    Review of Systems  Constitutional:  Positive for appetite change (variable). Negative for fever.  Gastrointestinal:  Positive for abdominal distention, abdominal pain, blood in stool (with mucus), diarrhea and nausea. Negative for vomiting.       Burping, no gerd  Musculoskeletal:  Positive for back pain (discomfort from abdomen).       Objective:   Vitals:   05/09/23 0841  BP: 106/72  Pulse: 68  Temp: 97.7 F (36.5 C)  SpO2: 97%   BP Readings from Last 3 Encounters:  05/09/23 106/72  04/21/23 108/74  09/20/22 114/72   Wt Readings from Last 3 Encounters:  05/09/23 157 lb 9.6 oz (71.5 kg)  04/21/23 160 lb 3.2 oz (72.7 kg)  09/20/22 155 lb (70.3 kg)   Body mass index is 27.92 kg/m.  Physical Exam Constitutional:      General: She is not in acute distress.    Appearance: Normal appearance. She is not ill-appearing.  HENT:     Head: Normocephalic and atraumatic.  Abdominal:     General: There is distension.     Palpations: Abdomen is soft.     Tenderness: There is abdominal tenderness (diffuse - mostly in LLQ, central lower abdomen and periumbical on left side). There is no guarding or rebound.     Hernia: A hernia (ventral - nontender, reducible) is present.  Musculoskeletal:     Right lower leg: No edema.     Left lower leg: No edema.  Skin:    General: Skin is warm and dry.  Neurological:     Mental Status: She is alert.        CT ABDOMEN PELVIS W CONTRAST CLINICAL DATA:  Left lower quadrant abdominal pain, diarrhea,  bloody mucus  EXAM: CT ABDOMEN AND PELVIS WITH CONTRAST  TECHNIQUE: Multidetector CT imaging of the abdomen and pelvis was performed using the standard protocol following bolus administration of intravenous contrast.  RADIATION DOSE REDUCTION: This exam was performed according to the departmental dose-optimization program which includes automated exposure control, adjustment of the mA and/or kV according to patient size and/or use of iterative reconstruction technique.  CONTRAST:  ISOVUE-300 IOPAMIDOL (ISOVUE-300) INJECTION 61%  COMPARISON:  Remote prior CT scan of the abdomen and pelvis 09/27/2016  FINDINGS: Lower chest: No acute abnormality.  Hepatobiliary: No focal liver abnormality is seen. Status post cholecystectomy. No biliary dilatation.  Pancreas: Unremarkable. No pancreatic ductal dilatation or surrounding inflammatory changes.  Spleen: Normal in size without focal abnormality.  Adrenals/Urinary Tract: Adrenal glands are unremarkable. Kidneys are normal, without renal calculi, focal lesion, or hydronephrosis. Bladder is unremarkable.  Stomach/Bowel: Stomach is within normal limits. Appendix appears normal. No evidence of bowel wall thickening, distention, or inflammatory changes.  Vascular/Lymphatic: No significant vascular findings are present. No enlarged abdominal or pelvic lymph nodes.  Reproductive: IUD in place. Unremarkable appearance of the ovaries. Incidentally, a 2.5 cm functional cyst is present in the left ovary. This is normal in a reproductive age female in no follow-up imaging is recommended.  Other: No abdominal wall hernia or abnormality. No abdominopelvic ascites.  Musculoskeletal: No acute or significant osseous findings.  IMPRESSION: 1. No acute abnormality within the abdomen or pelvis. 2. Incidental note is made of a 2.5 cm benign functional cyst in the left ovary. This could represent a source for the patient's left lower  quadrant pain if she is ovulating. No follow-up imaging is recommended. 3. IUD in place.  Electronically Signed   By: Malachy Moan M.D.   On: 05/09/2023 11:08       Assessment & Plan:    See Problem List for Assessment and Plan of chronic medical problems.

## 2023-05-08 NOTE — Patient Instructions (Addendum)
      Blood work was ordered.     Ct abdomen ordered.     Medications changes include :   zofran for nausea   A referral was ordered GI -- someone will call you to schedule an appointment.     Further treatment based on Ct results.

## 2023-05-09 ENCOUNTER — Other Ambulatory Visit (HOSPITAL_COMMUNITY): Payer: Self-pay

## 2023-05-09 ENCOUNTER — Ambulatory Visit (INDEPENDENT_AMBULATORY_CARE_PROVIDER_SITE_OTHER): Payer: 59 | Admitting: Internal Medicine

## 2023-05-09 ENCOUNTER — Ambulatory Visit
Admission: RE | Admit: 2023-05-09 | Discharge: 2023-05-09 | Disposition: A | Discharge status: Home / Self Care | Payer: 59 | Source: Ambulatory Visit | Attending: Internal Medicine | Admitting: Internal Medicine

## 2023-05-09 ENCOUNTER — Encounter: Payer: Self-pay | Admitting: Internal Medicine

## 2023-05-09 ENCOUNTER — Telehealth: Payer: Self-pay | Admitting: Internal Medicine

## 2023-05-09 VITALS — BP 106/72 | HR 68 | Temp 97.7°F | Ht 63.0 in | Wt 157.6 lb

## 2023-05-09 DIAGNOSIS — K921 Melena: Secondary | ICD-10-CM

## 2023-05-09 DIAGNOSIS — R1030 Lower abdominal pain, unspecified: Secondary | ICD-10-CM

## 2023-05-09 DIAGNOSIS — R197 Diarrhea, unspecified: Secondary | ICD-10-CM | POA: Diagnosis not present

## 2023-05-09 DIAGNOSIS — N83292 Other ovarian cyst, left side: Secondary | ICD-10-CM | POA: Diagnosis not present

## 2023-05-09 DIAGNOSIS — R1032 Left lower quadrant pain: Secondary | ICD-10-CM | POA: Diagnosis not present

## 2023-05-09 DIAGNOSIS — Z975 Presence of (intrauterine) contraceptive device: Secondary | ICD-10-CM | POA: Diagnosis not present

## 2023-05-09 LAB — COMPREHENSIVE METABOLIC PANEL
ALT: 21 U/L (ref 0–35)
AST: 18 U/L (ref 0–37)
Albumin: 4.7 g/dL (ref 3.5–5.2)
Alkaline Phosphatase: 79 U/L (ref 39–117)
BUN: 13 mg/dL (ref 6–23)
CO2: 28 mEq/L (ref 19–32)
Calcium: 9.8 mg/dL (ref 8.4–10.5)
Chloride: 103 mEq/L (ref 96–112)
Creatinine, Ser: 0.86 mg/dL (ref 0.40–1.20)
GFR: 83.31 mL/min (ref >60.00)
Glucose, Bld: 106 mg/dL — ABNORMAL HIGH (ref 70–99)
Potassium: 4.3 mEq/L (ref 3.5–5.1)
Sodium: 138 mEq/L (ref 135–145)
Total Bilirubin: 0.6 mg/dL (ref 0.2–1.2)
Total Protein: 7.9 g/dL (ref 6.0–8.3)

## 2023-05-09 LAB — CBC WITH DIFFERENTIAL/PLATELET
Basophils Absolute: 0 10*3/uL (ref 0.0–0.1)
Basophils Relative: 0.5 % (ref 0.0–3.0)
Eosinophils Absolute: 0.1 10*3/uL (ref 0.0–0.7)
Eosinophils Relative: 0.9 % (ref 0.0–5.0)
HCT: 44.8 % (ref 36.0–46.0)
Hemoglobin: 15.1 g/dL — ABNORMAL HIGH (ref 12.0–15.0)
Lymphocytes Relative: 27.9 % (ref 12.0–46.0)
Lymphs Abs: 2.2 10*3/uL (ref 0.7–4.0)
MCHC: 33.6 g/dL (ref 30.0–36.0)
MCV: 92.3 fl (ref 78.0–100.0)
Monocytes Absolute: 0.4 10*3/uL (ref 0.1–1.0)
Monocytes Relative: 4.6 % (ref 3.0–12.0)
Neutro Abs: 5.3 10*3/uL (ref 1.4–7.7)
Neutrophils Relative %: 66.1 % (ref 43.0–77.0)
Platelets: 258 10*3/uL (ref 150.0–400.0)
RBC: 4.86 Mil/uL (ref 3.87–5.11)
RDW: 12.8 % (ref 11.5–15.5)
WBC: 8 10*3/uL (ref 4.0–10.5)

## 2023-05-09 MED ORDER — ONDANSETRON HCL 4 MG PO TABS
4.0000 mg | ORAL_TABLET | Freq: Three times a day (TID) | ORAL | 0 refills | Status: DC | PRN
  Filled 2023-05-09: qty 20, 7d supply, fill #0

## 2023-05-09 MED ORDER — AZITHROMYCIN 500 MG PO TABS
500.0000 mg | ORAL_TABLET | Freq: Every day | ORAL | 0 refills | Status: DC
  Filled 2023-05-09: qty 3, 3d supply, fill #0

## 2023-05-09 MED ORDER — IOPAMIDOL (ISOVUE-300) INJECTION 61%
500.0000 mL | Freq: Once | INTRAVENOUS | Status: AC | PRN
  Administered 2023-05-09: 100 mL via INTRAVENOUS

## 2023-05-09 NOTE — Telephone Encounter (Signed)
Blood work looks normal and Ct scan does not show colitis.  She does have a functional cyst in her left ovary that is normal with regular ovulation.  This could potentially be causing some of her pain, but with her other symptoms most likely it is not causing the pain.  It is nothing to be concerned about these typically resolve on her own.  Transfer text    She needs to keep up good hydration.  She should use daily Zofran as needed.  Lets start an antibiotic just in case there is a possible infection.  If her not getting improvement we may need to have her submit stool studies.  Antibiotic sent to pharmacy-azithromycin 500 mg-once a day for 3 days  Please have her update [place] early next week how she is feeling.

## 2023-05-09 NOTE — Assessment & Plan Note (Addendum)
Acute Started 4 days ago associated with abdominal pain, diarrhea with blood and mucus, abdominal bloating, burping Stat Ct abd/pelvis - evaluate for colitis, ? IBD Cbc, cmp Referral to GI Zofran prn for nausea

## 2023-05-09 NOTE — Assessment & Plan Note (Addendum)
Acute Started 4 days ago associated with diarrhea with blood and mucus, abdominal bloating, burping Stat Ct abd/pelvis - evaluate for colitis, ? IBD Cbc, cmp - normal Referral to GI Zofran prn for nausea CT scan negative for acute colitis Will start empiric abx- azithromycin 500 mg daily x 3 given severity of symptoms

## 2023-05-09 NOTE — Assessment & Plan Note (Addendum)
Acute Started 4 days ago associated with abdominal pain, diarrhea with blood and mucus, abdominal bloating, burping Stat Ct abd/pelvis - evaluate for colitis, ? IBD Cbc, cmp Referral to GI Zofran prn for nausea Will start empiric abx - azithromycin 500 mg daily x 3 Consider stool studies if no improvement

## 2023-05-09 NOTE — Telephone Encounter (Signed)
Spoke with patient and info given 

## 2023-05-16 ENCOUNTER — Encounter: Payer: Self-pay | Admitting: Internal Medicine

## 2023-06-01 NOTE — Progress Notes (Unsigned)
Virtual Visit via Video Note  I connected with [name] on 06/01/23 at  1:20 PM EDT by a video enabled telemedicine application and verified that I am speaking with the correct person using two identifiers.   I discussed the limitations of evaluation and management by telemedicine and the availability of in person appointments. The patient expressed understanding and agreed to proceed.  Present for the visit:  Myself, Dr Cheryll Cockayne, Meredith Staggers.  The patient is currently at home and I am in the office.    No referring provider.    History of Present Illness: She is here for follow up of anxiety, depression.  6 weeks ago tapered off sertraline and started fluoxetine.  Still on xanax prn.    Saw her three weeks ago for lower abd pain, diarrhea with mucus/blood.  Ct w/o colitis.  Referred to GI.  Given azithro 500 mg x 3 days.  Sees GI 10/17  Social History   Socioeconomic History   Marital status: Married    Spouse name: Not on file   Number of children: 2   Years of education: Not on file   Highest education level: Bachelor's degree (e.g., BA, AB, BS)  Occupational History   Occupation: Charity fundraiser, [employer]  Tobacco Use   Smoking status: Never   Smokeless tobacco: Never  Vaping Use   Vaping status: Never Used  Substance and Sexual Activity   Alcohol use: Yes    Comment: 07/29/2018 "couple drinks/month"   Drug use: No   Sexual activity: Yes    Birth control/protection: I.U.D.  Other Topics Concern   Not on file  Social History Narrative   Not on file   Social Determinants of Health   Financial Resource Strain: Low Risk  (05/08/2023)   Overall Financial Resource Strain (CARDIA)    Difficulty of Paying Living Expenses: Not very hard  Food Insecurity: No Food Insecurity (05/08/2023)   Hunger Vital Sign    Worried About Running Out of Food in the Last Year: Never true    Ran Out of Food in the Last Year: Never true  Transportation Needs: No  Transportation Needs (05/08/2023)   PRAPARE - Administrator, Civil Service (Medical): No    Lack of Transportation (Non-Medical): No  Physical Activity: Insufficiently Active (05/08/2023)   Exercise Vital Sign    Days of Exercise per Week: 1 day    Minutes of Exercise per Session: 10 min  Stress: Stress Concern Present (05/08/2023)   [employer] of Occupational Health - Occupational Stress Questionnaire    Feeling of Stress : To some extent  Social Connections: Moderately Integrated (05/08/2023)   Social Connection and Isolation Panel [NHANES]    Frequency of Communication with Friends and Family: Twice a week    Frequency of Social Gatherings with Friends and Family: Once a week    Attends Religious Services: 1 to 4 times per year    Active Member of [employer] or Organizations: No    Attends Engineer, structural: Not on file    Marital Status: Married     Observations/Objective: Appears well in NAD   Assessment and Plan:  See Problem List for Assessment and Plan of chronic medical problems.   Follow Up Instructions:    I discussed the assessment and treatment plan with the patient. The patient was provided an opportunity to ask questions and all were answered. The patient agreed with the plan and demonstrated an understanding of the  instructions.   The patient was advised to call back or seek an in-person evaluation if the symptoms worsen or if the condition fails to improve as anticipated.    Pincus Sanes, MD

## 2023-06-02 ENCOUNTER — Encounter: Payer: Self-pay | Admitting: Internal Medicine

## 2023-06-02 ENCOUNTER — Telehealth (INDEPENDENT_AMBULATORY_CARE_PROVIDER_SITE_OTHER): Payer: 59 | Admitting: Internal Medicine

## 2023-06-02 DIAGNOSIS — F419 Anxiety disorder, unspecified: Secondary | ICD-10-CM | POA: Diagnosis not present

## 2023-06-02 DIAGNOSIS — F32A Depression, unspecified: Secondary | ICD-10-CM | POA: Diagnosis not present

## 2023-06-02 NOTE — Assessment & Plan Note (Signed)
Chronic Successfully tapered off sertraline and currently on fluoxetine 20 mg daily She does feel better on the fluoxetine and will continue this dose for another 2 weeks and then she can let me know if she feels like she needs a higher dose in which case we would increase to 40 mg daily Continue fluoxetine 20 mg daily, alprazolam 0.25 mg 3 times daily as needed

## 2023-06-05 ENCOUNTER — Other Ambulatory Visit: Payer: Self-pay

## 2023-06-05 ENCOUNTER — Other Ambulatory Visit (HOSPITAL_COMMUNITY): Payer: Self-pay

## 2023-06-05 ENCOUNTER — Other Ambulatory Visit: Payer: Self-pay | Admitting: Internal Medicine

## 2023-06-05 MED ORDER — ALPRAZOLAM 0.25 MG PO TABS
0.2500 mg | ORAL_TABLET | Freq: Three times a day (TID) | ORAL | 1 refills | Status: DC | PRN
Start: 2023-06-05 — End: 2024-02-10
  Filled 2023-06-05: qty 30, 10d supply, fill #0
  Filled 2023-10-24: qty 30, 10d supply, fill #1

## 2023-06-06 ENCOUNTER — Other Ambulatory Visit: Payer: Self-pay

## 2023-06-16 ENCOUNTER — Other Ambulatory Visit (HOSPITAL_COMMUNITY): Payer: Self-pay

## 2023-07-03 ENCOUNTER — Ambulatory Visit: Payer: 59 | Admitting: Physician Assistant

## 2023-07-22 ENCOUNTER — Encounter (INDEPENDENT_AMBULATORY_CARE_PROVIDER_SITE_OTHER): Payer: 59 | Admitting: Internal Medicine

## 2023-07-22 DIAGNOSIS — F419 Anxiety disorder, unspecified: Secondary | ICD-10-CM

## 2023-07-24 ENCOUNTER — Other Ambulatory Visit (HOSPITAL_COMMUNITY): Payer: Self-pay

## 2023-07-24 DIAGNOSIS — F32A Depression, unspecified: Secondary | ICD-10-CM

## 2023-07-24 DIAGNOSIS — F419 Anxiety disorder, unspecified: Secondary | ICD-10-CM | POA: Diagnosis not present

## 2023-07-24 MED ORDER — FLUOXETINE HCL 40 MG PO CAPS
40.0000 mg | ORAL_CAPSULE | Freq: Every day | ORAL | 1 refills | Status: DC
Start: 2023-07-24 — End: 2024-02-10
  Filled 2023-07-24: qty 90, 90d supply, fill #0
  Filled 2023-10-24: qty 90, 90d supply, fill #1

## 2023-07-24 NOTE — Telephone Encounter (Signed)

## 2023-10-07 DIAGNOSIS — Z961 Presence of intraocular lens: Secondary | ICD-10-CM | POA: Diagnosis not present

## 2023-10-07 DIAGNOSIS — H26492 Other secondary cataract, left eye: Secondary | ICD-10-CM | POA: Diagnosis not present

## 2023-10-20 DIAGNOSIS — H26492 Other secondary cataract, left eye: Secondary | ICD-10-CM | POA: Diagnosis not present

## 2023-10-24 ENCOUNTER — Other Ambulatory Visit: Payer: Self-pay

## 2023-10-24 ENCOUNTER — Other Ambulatory Visit (HOSPITAL_COMMUNITY): Payer: Self-pay

## 2023-11-04 DIAGNOSIS — H35372 Puckering of macula, left eye: Secondary | ICD-10-CM | POA: Diagnosis not present

## 2023-11-04 DIAGNOSIS — Z8669 Personal history of other diseases of the nervous system and sense organs: Secondary | ICD-10-CM | POA: Diagnosis not present

## 2023-11-04 DIAGNOSIS — H59812 Chorioretinal scars after surgery for detachment, left eye: Secondary | ICD-10-CM | POA: Diagnosis not present

## 2024-01-05 DIAGNOSIS — Z1231 Encounter for screening mammogram for malignant neoplasm of breast: Secondary | ICD-10-CM | POA: Diagnosis not present

## 2024-01-05 DIAGNOSIS — Z1331 Encounter for screening for depression: Secondary | ICD-10-CM | POA: Diagnosis not present

## 2024-01-05 DIAGNOSIS — Z113 Encounter for screening for infections with a predominantly sexual mode of transmission: Secondary | ICD-10-CM | POA: Diagnosis not present

## 2024-01-05 DIAGNOSIS — Z01411 Encounter for gynecological examination (general) (routine) with abnormal findings: Secondary | ICD-10-CM | POA: Diagnosis not present

## 2024-01-05 DIAGNOSIS — Z01419 Encounter for gynecological examination (general) (routine) without abnormal findings: Secondary | ICD-10-CM | POA: Diagnosis not present

## 2024-01-05 DIAGNOSIS — Z124 Encounter for screening for malignant neoplasm of cervix: Secondary | ICD-10-CM | POA: Diagnosis not present

## 2024-01-05 LAB — HM MAMMOGRAPHY

## 2024-02-10 ENCOUNTER — Other Ambulatory Visit (HOSPITAL_COMMUNITY): Payer: Self-pay

## 2024-02-10 ENCOUNTER — Other Ambulatory Visit: Payer: Self-pay

## 2024-02-10 ENCOUNTER — Other Ambulatory Visit: Payer: Self-pay | Admitting: Internal Medicine

## 2024-02-11 ENCOUNTER — Other Ambulatory Visit (HOSPITAL_COMMUNITY): Payer: Self-pay

## 2024-02-11 MED ORDER — FLUOXETINE HCL 40 MG PO CAPS
40.0000 mg | ORAL_CAPSULE | Freq: Every day | ORAL | 0 refills | Status: DC
  Filled 2024-02-11: qty 90, 90d supply, fill #0

## 2024-02-11 MED ORDER — ALPRAZOLAM 0.25 MG PO TABS
0.2500 mg | ORAL_TABLET | Freq: Three times a day (TID) | ORAL | 0 refills | Status: DC | PRN
  Filled 2024-02-11: qty 30, 10d supply, fill #0

## 2024-06-02 ENCOUNTER — Other Ambulatory Visit: Payer: Self-pay | Admitting: Internal Medicine

## 2024-06-02 ENCOUNTER — Other Ambulatory Visit (HOSPITAL_COMMUNITY): Payer: Self-pay

## 2024-06-02 MED ORDER — SPIRONOLACTONE 50 MG PO TABS
50.0000 mg | ORAL_TABLET | Freq: Two times a day (BID) | ORAL | 1 refills | Status: AC
  Filled 2024-06-02: qty 180, 90d supply, fill #0

## 2024-06-02 MED ORDER — FLUOXETINE HCL 40 MG PO CAPS
40.0000 mg | ORAL_CAPSULE | Freq: Every day | ORAL | 0 refills | Status: DC
  Filled 2024-06-02: qty 90, 90d supply, fill #0

## 2024-06-03 ENCOUNTER — Other Ambulatory Visit (HOSPITAL_COMMUNITY): Payer: Self-pay

## 2024-08-30 ENCOUNTER — Other Ambulatory Visit (HOSPITAL_COMMUNITY): Payer: Self-pay | Admitting: Cardiology

## 2024-08-30 ENCOUNTER — Other Ambulatory Visit (HOSPITAL_COMMUNITY): Payer: Self-pay

## 2024-08-30 MED ORDER — FLUCONAZOLE 150 MG PO TABS
150.0000 mg | ORAL_TABLET | Freq: Every day | ORAL | 1 refills | Status: DC
  Filled 2024-08-30: qty 2, 2d supply, fill #0

## 2024-09-14 ENCOUNTER — Encounter (HOSPITAL_COMMUNITY): Payer: Self-pay

## 2024-09-14 ENCOUNTER — Other Ambulatory Visit (HOSPITAL_COMMUNITY): Payer: Self-pay

## 2024-09-14 ENCOUNTER — Other Ambulatory Visit: Payer: Self-pay

## 2024-09-14 ENCOUNTER — Other Ambulatory Visit: Payer: Self-pay | Admitting: Internal Medicine

## 2024-09-14 MED ORDER — FLUOXETINE HCL 40 MG PO CAPS
40.0000 mg | ORAL_CAPSULE | Freq: Every day | ORAL | 0 refills | Status: AC
  Filled 2024-09-14: qty 90, 90d supply, fill #0

## 2024-09-23 ENCOUNTER — Encounter: Payer: Self-pay | Admitting: Internal Medicine

## 2024-09-23 NOTE — Progress Notes (Signed)
 "     Subjective:    Patient ID: [name], female    DOB: 08-Jun-1981, 44 y.o.   MRN: [id number]     HPI Yoana is here for follow up of her chronic medical problems.  No concerns-everything is going well.  No regular exercise.    Medications and allergies reviewed with patient and updated if appropriate.  Medications Ordered Prior to Encounter[1]   Review of Systems  Constitutional:  Negative for fever.  Respiratory:  Negative for cough, shortness of breath and wheezing.   Cardiovascular:  Negative for chest pain, palpitations and leg swelling.  Neurological:  Positive for headaches (occ). Negative for light-headedness.       Objective:   Vitals:   09/24/24 1306  BP: 120/76  Pulse: 66  Temp: 98.4 F (36.9 C)  SpO2: 99%   BP Readings from Last 3 Encounters:  09/24/24 120/76  05/09/23 106/72  04/21/23 108/74   Wt Readings from Last 3 Encounters:  09/24/24 155 lb (70.3 kg)  05/09/23 157 lb 9.6 oz (71.5 kg)  04/21/23 160 lb 3.2 oz (72.7 kg)   Body mass index is 27.46 kg/m.    Physical Exam Constitutional:      General: She is not in acute distress.    Appearance: Normal appearance.  HENT:     Head: Normocephalic and atraumatic.  Eyes:     Conjunctiva/sclera: Conjunctivae normal.  Cardiovascular:     Rate and Rhythm: Normal rate and regular rhythm.     Heart sounds: Normal heart sounds.  Pulmonary:     Effort: Pulmonary effort is normal. No respiratory distress.     Breath sounds: Normal breath sounds. No wheezing.  Musculoskeletal:     Cervical back: Neck supple.     Right lower leg: No edema.     Left lower leg: No edema.  Lymphadenopathy:     Cervical: No cervical adenopathy.  Skin:    General: Skin is warm and dry.     Findings: No rash.  Neurological:     Mental Status: She is alert. Mental status is at baseline.  Psychiatric:        Mood and Affect: Mood normal.        Behavior: Behavior normal.        Lab  Results  Component Value Date   WBC 8.0 05/09/2023   HGB 15.1 (H) 05/09/2023   HCT 44.8 05/09/2023   PLT 258.0 05/09/2023   GLUCOSE 106 (H) 05/09/2023   CHOL 115 03/14/2023   TRIG 71.0 03/14/2023   HDL 45.30 03/14/2023   LDLCALC 55 03/14/2023   ALT 21 05/09/2023   AST 18 05/09/2023   NA 138 05/09/2023   K 4.3 05/09/2023   CL 103 05/09/2023   CREATININE 0.86 05/09/2023   BUN 13 05/09/2023   CO2 28 05/09/2023   TSH 2.38 03/14/2023   HGBA1C 5.5 03/14/2023     Assessment & Plan:    See Problem List for Assessment and Plan of chronic medical problems.       [1]  Current Outpatient Medications on File Prior to Visit  Medication Sig Dispense Refill   ALPRAZolam  (XANAX ) 0.25 MG tablet Take 1 tablet (0.25 mg total) by mouth 3 (three) times daily as needed for anxiety. 30 tablet 0   cetirizine (ZYRTEC) 10 MG tablet Take 10 mg by mouth daily as needed for allergies.      famotidine (PEPCID) 20 MG tablet      FLUoxetine  (PROZAC )  40 MG capsule Take 1 capsule (40 mg total) by mouth daily. 90 capsule 0   ibuprofen  (IBU-200) 200 MG tablet      levonorgestrel  (MIRENA , 52 MG,) 20 MCG/DAY IUD Take by intrauterine route.     Multiple Vitamin (MULTIVITAMIN) capsule Take 1 capsule by mouth daily.     Probiotic Product (PROBIOTIC PO) Take by mouth.     spironolactone  (ALDACTONE ) 50 MG tablet Take 1 tablet (50 mg total) by mouth 2 (two) times daily. 180 tablet 1   traZODone  (DESYREL ) 50 MG tablet Take 0.5-1 tablets (25-50 mg total) by mouth at bedtime as needed for sleep 90 tablet 1   triamcinolone  (KENALOG ) 0.147 MG/GM topical spray APPLY TO AFFECTED AREA(S) TWICE DAILY 63 g 2   triamcinolone  cream (KENALOG ) 0.1 % Apply 1 Application topically 2 (two) times daily. 30 g 2   No current facility-administered medications on file prior to visit.   "

## 2024-09-23 NOTE — Patient Instructions (Addendum)
" ° ° ° ° ° ° °  Medications changes include :   None       Return in about 1 year (around 09/24/2025) for Physical Exam.  "

## 2024-09-24 ENCOUNTER — Other Ambulatory Visit (HOSPITAL_COMMUNITY): Payer: Self-pay

## 2024-09-24 ENCOUNTER — Ambulatory Visit: Admitting: Internal Medicine

## 2024-09-24 VITALS — BP 120/76 | HR 66 | Temp 98.4°F | Ht 63.0 in | Wt 155.0 lb

## 2024-09-24 DIAGNOSIS — G4709 Other insomnia: Secondary | ICD-10-CM | POA: Diagnosis not present

## 2024-09-24 DIAGNOSIS — K219 Gastro-esophageal reflux disease without esophagitis: Secondary | ICD-10-CM | POA: Diagnosis not present

## 2024-09-24 DIAGNOSIS — R7303 Prediabetes: Secondary | ICD-10-CM

## 2024-09-24 DIAGNOSIS — F32A Depression, unspecified: Secondary | ICD-10-CM | POA: Diagnosis not present

## 2024-09-24 DIAGNOSIS — L700 Acne vulgaris: Secondary | ICD-10-CM

## 2024-09-24 DIAGNOSIS — F410 Panic disorder [episodic paroxysmal anxiety] without agoraphobia: Secondary | ICD-10-CM | POA: Diagnosis not present

## 2024-09-24 DIAGNOSIS — F419 Anxiety disorder, unspecified: Secondary | ICD-10-CM

## 2024-09-24 MED ORDER — ALPRAZOLAM 0.25 MG PO TABS
0.2500 mg | ORAL_TABLET | Freq: Three times a day (TID) | ORAL | 0 refills | Status: AC | PRN
  Filled 2024-09-24: qty 30, 10d supply, fill #0

## 2024-09-24 NOTE — Assessment & Plan Note (Signed)
 Chronic Controlled, Stable Continue fluoxetine  40 mg daily, alprazolam  0.25 mg 3 times daily as needed

## 2024-09-24 NOTE — Assessment & Plan Note (Signed)
 Chronic Lab Results  Component Value Date   HGBA1C 5.5 03/14/2023   Sugars in normal range from last checked Low sugar / carb diet Stressed regular exercise

## 2024-09-24 NOTE — Assessment & Plan Note (Signed)
 Chronic GERD controlled Over-the-counter Pepcid as needed

## 2024-09-24 NOTE — Assessment & Plan Note (Signed)
Chronic Controlled Continue spironolactone 50 mg twice daily

## 2024-09-24 NOTE — Assessment & Plan Note (Signed)
Chronic Controlled, Stable Continue trazodone 25 mg bedtime as needed - taking most nights

## 2024-09-24 NOTE — Assessment & Plan Note (Signed)
 Chronic Controlled Continue fluoxetine  40 mg daily, alprazolam  0.25 mg 3 times daily as needed
